# Patient Record
Sex: Female | Born: 1950
Health system: Southern US, Community
[De-identification: ages and names within clinical notes are randomized; demographics above are authoritative.]

## PROBLEM LIST (undated history)

## (undated) DIAGNOSIS — J189 Pneumonia, unspecified organism: Secondary | ICD-10-CM

## (undated) DIAGNOSIS — I499 Cardiac arrhythmia, unspecified: Secondary | ICD-10-CM

## (undated) DIAGNOSIS — Z8489 Family history of other specified conditions: Secondary | ICD-10-CM

## (undated) DIAGNOSIS — M1990 Unspecified osteoarthritis, unspecified site: Secondary | ICD-10-CM

## (undated) DIAGNOSIS — F419 Anxiety disorder, unspecified: Secondary | ICD-10-CM

## (undated) DIAGNOSIS — E7800 Pure hypercholesterolemia, unspecified: Secondary | ICD-10-CM

## (undated) DIAGNOSIS — M1712 Unilateral primary osteoarthritis, left knee: Secondary | ICD-10-CM

## (undated) DIAGNOSIS — D649 Anemia, unspecified: Secondary | ICD-10-CM

## (undated) DIAGNOSIS — M1711 Unilateral primary osteoarthritis, right knee: Secondary | ICD-10-CM

## (undated) DIAGNOSIS — Z96659 Presence of unspecified artificial knee joint: Secondary | ICD-10-CM

## (undated) DIAGNOSIS — F329 Major depressive disorder, single episode, unspecified: Secondary | ICD-10-CM

## (undated) DIAGNOSIS — F32A Depression, unspecified: Secondary | ICD-10-CM

## (undated) DIAGNOSIS — I10 Essential (primary) hypertension: Secondary | ICD-10-CM

## (undated) DIAGNOSIS — R002 Palpitations: Secondary | ICD-10-CM

## (undated) DIAGNOSIS — I480 Paroxysmal atrial fibrillation: Secondary | ICD-10-CM

## (undated) HISTORY — PX: APPENDECTOMY: SHX54

## (undated) HISTORY — DX: Palpitations: R00.2

## (undated) HISTORY — DX: Presence of unspecified artificial knee joint: Z96.659

## (undated) HISTORY — DX: Pure hypercholesterolemia, unspecified: E78.00

## (undated) HISTORY — DX: Paroxysmal atrial fibrillation: I48.0

## (undated) HISTORY — PX: ABDOMINAL HYSTERECTOMY: SHX81

---

## 2003-04-22 HISTORY — PX: CARPAL TUNNEL RELEASE: SHX101

## 2005-06-11 ENCOUNTER — Encounter: Admission: RE | Admit: 2005-06-11 | Discharge: 2005-06-11 | Payer: Self-pay | Admitting: Vascular Surgery

## 2005-06-16 ENCOUNTER — Encounter: Admission: RE | Admit: 2005-06-16 | Discharge: 2005-06-16 | Payer: Self-pay | Admitting: Vascular Surgery

## 2006-06-02 ENCOUNTER — Encounter: Admission: RE | Admit: 2006-06-02 | Discharge: 2006-06-02 | Payer: Self-pay | Admitting: Obstetrics and Gynecology

## 2007-06-04 ENCOUNTER — Encounter: Admission: RE | Admit: 2007-06-04 | Discharge: 2007-06-04 | Payer: Self-pay | Admitting: Obstetrics and Gynecology

## 2008-06-05 ENCOUNTER — Encounter: Admission: RE | Admit: 2008-06-05 | Discharge: 2008-06-05 | Payer: Self-pay | Admitting: Internal Medicine

## 2009-06-06 ENCOUNTER — Encounter: Admission: RE | Admit: 2009-06-06 | Discharge: 2009-06-06 | Payer: Self-pay | Admitting: Internal Medicine

## 2010-05-17 ENCOUNTER — Other Ambulatory Visit: Payer: Self-pay | Admitting: Internal Medicine

## 2010-05-17 DIAGNOSIS — Z1239 Encounter for other screening for malignant neoplasm of breast: Secondary | ICD-10-CM

## 2010-06-10 ENCOUNTER — Ambulatory Visit
Admission: RE | Admit: 2010-06-10 | Discharge: 2010-06-10 | Disposition: A | Discharge status: Home / Self Care | Payer: BC Managed Care – PPO | Source: Ambulatory Visit | Attending: Internal Medicine | Admitting: Internal Medicine

## 2010-06-10 DIAGNOSIS — Z1239 Encounter for other screening for malignant neoplasm of breast: Secondary | ICD-10-CM

## 2011-05-12 ENCOUNTER — Other Ambulatory Visit: Payer: Self-pay | Admitting: Internal Medicine

## 2011-05-12 DIAGNOSIS — Z1231 Encounter for screening mammogram for malignant neoplasm of breast: Secondary | ICD-10-CM

## 2011-06-16 ENCOUNTER — Ambulatory Visit
Admission: RE | Admit: 2011-06-16 | Discharge: 2011-06-16 | Disposition: A | Discharge status: Home / Self Care | Payer: BC Managed Care – PPO | Source: Ambulatory Visit | Attending: Internal Medicine | Admitting: Internal Medicine

## 2011-06-16 DIAGNOSIS — Z1231 Encounter for screening mammogram for malignant neoplasm of breast: Secondary | ICD-10-CM

## 2012-05-24 ENCOUNTER — Other Ambulatory Visit: Payer: Self-pay | Admitting: Internal Medicine

## 2012-05-24 ENCOUNTER — Other Ambulatory Visit (INDEPENDENT_AMBULATORY_CARE_PROVIDER_SITE_OTHER): Payer: Self-pay | Admitting: Surgery

## 2012-05-24 DIAGNOSIS — Z1231 Encounter for screening mammogram for malignant neoplasm of breast: Secondary | ICD-10-CM

## 2012-06-22 ENCOUNTER — Ambulatory Visit
Admission: RE | Admit: 2012-06-22 | Discharge: 2012-06-22 | Disposition: A | Discharge status: Home / Self Care | Payer: BC Managed Care – PPO | Source: Ambulatory Visit | Attending: Internal Medicine | Admitting: Internal Medicine

## 2012-06-22 DIAGNOSIS — Z1231 Encounter for screening mammogram for malignant neoplasm of breast: Secondary | ICD-10-CM

## 2013-05-27 ENCOUNTER — Other Ambulatory Visit: Payer: Self-pay

## 2013-05-27 DIAGNOSIS — Z1231 Encounter for screening mammogram for malignant neoplasm of breast: Secondary | ICD-10-CM

## 2013-06-27 ENCOUNTER — Ambulatory Visit
Admission: RE | Admit: 2013-06-27 | Discharge: 2013-06-27 | Disposition: A | Discharge status: Home / Self Care | Payer: BC Managed Care – PPO | Source: Ambulatory Visit

## 2013-06-27 DIAGNOSIS — Z1231 Encounter for screening mammogram for malignant neoplasm of breast: Secondary | ICD-10-CM

## 2014-05-30 ENCOUNTER — Other Ambulatory Visit: Payer: Self-pay

## 2014-05-30 DIAGNOSIS — Z1231 Encounter for screening mammogram for malignant neoplasm of breast: Secondary | ICD-10-CM

## 2014-07-03 ENCOUNTER — Ambulatory Visit
Admission: RE | Admit: 2014-07-03 | Discharge: 2014-07-03 | Disposition: A | Discharge status: Home / Self Care | Payer: BLUE CROSS/BLUE SHIELD | Source: Ambulatory Visit

## 2014-07-03 DIAGNOSIS — Z1231 Encounter for screening mammogram for malignant neoplasm of breast: Secondary | ICD-10-CM

## 2015-04-29 DIAGNOSIS — I480 Paroxysmal atrial fibrillation: Secondary | ICD-10-CM

## 2015-04-29 HISTORY — DX: Paroxysmal atrial fibrillation: I48.0

## 2015-05-16 DIAGNOSIS — J0100 Acute maxillary sinusitis, unspecified: Secondary | ICD-10-CM | POA: Diagnosis not present

## 2015-05-17 DIAGNOSIS — Z6841 Body Mass Index (BMI) 40.0 and over, adult: Secondary | ICD-10-CM | POA: Diagnosis not present

## 2015-05-17 DIAGNOSIS — I10 Essential (primary) hypertension: Secondary | ICD-10-CM | POA: Diagnosis not present

## 2015-05-17 DIAGNOSIS — I480 Paroxysmal atrial fibrillation: Secondary | ICD-10-CM | POA: Diagnosis not present

## 2015-05-17 DIAGNOSIS — R0683 Snoring: Secondary | ICD-10-CM | POA: Diagnosis not present

## 2015-05-28 ENCOUNTER — Other Ambulatory Visit: Payer: Self-pay

## 2015-05-28 DIAGNOSIS — Z1231 Encounter for screening mammogram for malignant neoplasm of breast: Secondary | ICD-10-CM

## 2015-05-29 DIAGNOSIS — I480 Paroxysmal atrial fibrillation: Secondary | ICD-10-CM | POA: Diagnosis not present

## 2015-07-09 ENCOUNTER — Ambulatory Visit: Payer: BLUE CROSS/BLUE SHIELD

## 2015-07-10 DIAGNOSIS — E785 Hyperlipidemia, unspecified: Secondary | ICD-10-CM | POA: Diagnosis not present

## 2015-07-10 DIAGNOSIS — Z79899 Other long term (current) drug therapy: Secondary | ICD-10-CM | POA: Diagnosis not present

## 2015-07-23 ENCOUNTER — Ambulatory Visit: Payer: BLUE CROSS/BLUE SHIELD

## 2015-08-15 DIAGNOSIS — I10 Essential (primary) hypertension: Secondary | ICD-10-CM | POA: Diagnosis not present

## 2015-08-15 DIAGNOSIS — I480 Paroxysmal atrial fibrillation: Secondary | ICD-10-CM | POA: Diagnosis not present

## 2015-09-18 DIAGNOSIS — R5383 Other fatigue: Secondary | ICD-10-CM | POA: Diagnosis not present

## 2015-09-18 DIAGNOSIS — Z79899 Other long term (current) drug therapy: Secondary | ICD-10-CM | POA: Diagnosis not present

## 2015-09-18 DIAGNOSIS — I480 Paroxysmal atrial fibrillation: Secondary | ICD-10-CM | POA: Diagnosis not present

## 2015-09-18 DIAGNOSIS — I10 Essential (primary) hypertension: Secondary | ICD-10-CM | POA: Diagnosis not present

## 2015-09-18 DIAGNOSIS — E785 Hyperlipidemia, unspecified: Secondary | ICD-10-CM | POA: Diagnosis not present

## 2015-09-18 DIAGNOSIS — M1712 Unilateral primary osteoarthritis, left knee: Secondary | ICD-10-CM | POA: Diagnosis not present

## 2015-12-10 ENCOUNTER — Ambulatory Visit
Admission: RE | Admit: 2015-12-10 | Discharge: 2015-12-10 | Disposition: A | Discharge status: Home / Self Care | Payer: PPO | Source: Ambulatory Visit

## 2015-12-10 DIAGNOSIS — Z1231 Encounter for screening mammogram for malignant neoplasm of breast: Secondary | ICD-10-CM

## 2015-12-13 DIAGNOSIS — L820 Inflamed seborrheic keratosis: Secondary | ICD-10-CM | POA: Diagnosis not present

## 2016-01-24 DIAGNOSIS — M170 Bilateral primary osteoarthritis of knee: Secondary | ICD-10-CM | POA: Diagnosis not present

## 2016-01-24 DIAGNOSIS — M25561 Pain in right knee: Secondary | ICD-10-CM | POA: Diagnosis not present

## 2016-01-24 DIAGNOSIS — M25562 Pain in left knee: Secondary | ICD-10-CM | POA: Diagnosis not present

## 2016-01-24 DIAGNOSIS — G8929 Other chronic pain: Secondary | ICD-10-CM | POA: Diagnosis not present

## 2016-03-03 DIAGNOSIS — I480 Paroxysmal atrial fibrillation: Secondary | ICD-10-CM | POA: Diagnosis not present

## 2016-03-18 DIAGNOSIS — I10 Essential (primary) hypertension: Secondary | ICD-10-CM | POA: Diagnosis not present

## 2016-03-18 DIAGNOSIS — Z79899 Other long term (current) drug therapy: Secondary | ICD-10-CM | POA: Diagnosis not present

## 2016-03-18 DIAGNOSIS — E785 Hyperlipidemia, unspecified: Secondary | ICD-10-CM | POA: Diagnosis not present

## 2016-03-18 DIAGNOSIS — R7301 Impaired fasting glucose: Secondary | ICD-10-CM | POA: Diagnosis not present

## 2016-03-18 DIAGNOSIS — M2550 Pain in unspecified joint: Secondary | ICD-10-CM | POA: Diagnosis not present

## 2016-03-18 DIAGNOSIS — I480 Paroxysmal atrial fibrillation: Secondary | ICD-10-CM | POA: Diagnosis not present

## 2016-05-03 DIAGNOSIS — J111 Influenza due to unidentified influenza virus with other respiratory manifestations: Secondary | ICD-10-CM | POA: Diagnosis not present

## 2016-06-26 DIAGNOSIS — I480 Paroxysmal atrial fibrillation: Secondary | ICD-10-CM | POA: Diagnosis not present

## 2016-06-26 DIAGNOSIS — R040 Epistaxis: Secondary | ICD-10-CM | POA: Diagnosis not present

## 2016-07-09 DIAGNOSIS — H52223 Regular astigmatism, bilateral: Secondary | ICD-10-CM | POA: Diagnosis not present

## 2016-07-09 DIAGNOSIS — H2513 Age-related nuclear cataract, bilateral: Secondary | ICD-10-CM | POA: Diagnosis not present

## 2016-07-23 DIAGNOSIS — M170 Bilateral primary osteoarthritis of knee: Secondary | ICD-10-CM | POA: Diagnosis not present

## 2016-08-06 DIAGNOSIS — J0100 Acute maxillary sinusitis, unspecified: Secondary | ICD-10-CM | POA: Diagnosis not present

## 2016-09-16 DIAGNOSIS — I480 Paroxysmal atrial fibrillation: Secondary | ICD-10-CM | POA: Diagnosis not present

## 2016-09-16 DIAGNOSIS — E785 Hyperlipidemia, unspecified: Secondary | ICD-10-CM | POA: Diagnosis not present

## 2016-09-16 DIAGNOSIS — M1712 Unilateral primary osteoarthritis, left knee: Secondary | ICD-10-CM | POA: Diagnosis not present

## 2016-09-16 DIAGNOSIS — I10 Essential (primary) hypertension: Secondary | ICD-10-CM | POA: Diagnosis not present

## 2016-09-16 DIAGNOSIS — Z79899 Other long term (current) drug therapy: Secondary | ICD-10-CM | POA: Diagnosis not present

## 2016-11-07 DIAGNOSIS — I480 Paroxysmal atrial fibrillation: Secondary | ICD-10-CM | POA: Diagnosis not present

## 2016-11-07 DIAGNOSIS — Z01818 Encounter for other preprocedural examination: Secondary | ICD-10-CM | POA: Diagnosis not present

## 2016-11-07 DIAGNOSIS — I10 Essential (primary) hypertension: Secondary | ICD-10-CM | POA: Diagnosis not present

## 2016-11-07 DIAGNOSIS — M1712 Unilateral primary osteoarthritis, left knee: Secondary | ICD-10-CM | POA: Diagnosis not present

## 2016-11-24 ENCOUNTER — Telehealth: Payer: Self-pay

## 2016-11-24 NOTE — Telephone Encounter (Signed)
Left message to return call 

## 2016-11-24 NOTE — Telephone Encounter (Signed)
Patient informed to stop 3 days prior to surgery, which is 6 doses. Patient verbalized understanding.

## 2016-11-24 NOTE — Telephone Encounter (Signed)
3 days = 6 doses prior to surgery

## 2016-11-24 NOTE — Telephone Encounter (Signed)
Patient is having surgery 12/09/16, knee replacement. Wants to know when to stop Eliquis.

## 2016-11-24 NOTE — Telephone Encounter (Signed)
Call patient med question/surgery clear. Thanks

## 2016-11-25 ENCOUNTER — Other Ambulatory Visit: Payer: Self-pay | Admitting: Orthopedic Surgery

## 2016-11-26 HISTORY — PX: DILATION AND CURETTAGE OF UTERUS: SHX78

## 2016-11-26 NOTE — Pre-Procedure Instructions (Signed)
Bynum BellowsJanice Robbins Nier  11/26/2016      [facility] [place] - [address] [place] [address] Phone: [phone] Fax: [phone]    Your procedure is scheduled on August 21  Report to [facility] Admitting at 0900 A.M.  Call this number if you have problems the morning of surgery:  [phone]   Remember:  Do not eat food or drink liquids after midnight. Continue all other medications as directed by your physician except follow these instructions about you medications    Take these medicines the morning of surgery with A SIP OF WATER acetaminophen (TYLENOL),  cetirizine (ZYRTEC), sertraline (ZOLOFT), traMADol (ULTRAM)  7 days prior to surgery STOP taking any Aspirin, Aleve, Naproxen, Ibuprofen, Motrin, Advil, Goody's, BC's, all herbal medications, fish oil, and all vitamins  Stop ELIQUIS 3 days prior to surgery   Do not wear jewelry, make-up or nail polish.  Do not wear lotions, powders, or perfumes, or deoderant.  Do not shave 48 hours prior to surgery.  Men may shave face and neck.  Do not bring valuables to the hospital.  [facility] is not responsible for any belongings or valuables.  Contacts, dentures or bridgework may not be worn into surgery.  Leave your suitcase in the car.  After surgery it may be brought to your room.  For patients admitted to the hospital, discharge time will be determined by your treatment team.  Patients discharged the day of surgery will not be allowed to drive home.   Special instructions:   [place]- Preparing For Surgery  Before surgery, you can play an important role. Because skin is not sterile, your skin needs to be as free of germs as possible. You can reduce the number of germs on your skin by washing with CHG (chlorahexidine gluconate) Soap before surgery.  CHG is an antiseptic cleaner which kills germs and bonds with the skin to continue killing germs even after  washing.  Please do not use if you have an allergy to CHG or antibacterial soaps. If your skin becomes reddened/irritated stop using the CHG.  Do not shave (including legs and underarms) for at least 48 hours prior to first CHG shower. It is OK to shave your face.  Please follow these instructions carefully.   1. Shower the NIGHT BEFORE SURGERY and the MORNING OF SURGERY with CHG.   2. If you chose to wash your hair, wash your hair first as usual with your normal shampoo.  3. After you shampoo, rinse your hair and body thoroughly to remove the shampoo.  4. Use CHG as you would any other liquid soap. You can apply CHG directly to the skin and wash gently with a scrungie or a clean washcloth.   5. Apply the CHG Soap to your body ONLY FROM THE NECK DOWN.  Do not use on open wounds or open sores. Avoid contact with your eyes, ears, mouth and genitals (private parts). Wash genitals (private parts) with your normal soap.  6. Wash thoroughly, paying special attention to the area where your surgery will be performed.  7. Thoroughly rinse your body with warm water from the neck down.  8. DO NOT shower/wash with your normal soap after using and rinsing off the CHG Soap.  9. Pat yourself dry with a CLEAN TOWEL.   10. Wear CLEAN PAJAMAS   11. Place CLEAN SHEETS on your bed the night of your first shower and DO  NOT SLEEP WITH PETS.    Day of Surgery: Do not apply any deodorants/lotions. Please wear clean clothes to the hospital/surgery center.      Please read over the following fact sheets that you were given.

## 2016-11-27 ENCOUNTER — Encounter (HOSPITAL_COMMUNITY): Payer: Self-pay

## 2016-11-27 ENCOUNTER — Encounter (HOSPITAL_COMMUNITY)
Admission: RE | Admit: 2016-11-27 | Discharge: 2016-11-27 | Disposition: A | Discharge status: Home / Self Care | Payer: PPO | Source: Ambulatory Visit | Attending: Orthopedic Surgery | Admitting: Orthopedic Surgery

## 2016-11-27 DIAGNOSIS — Z01818 Encounter for other preprocedural examination: Secondary | ICD-10-CM | POA: Diagnosis not present

## 2016-11-27 DIAGNOSIS — M1712 Unilateral primary osteoarthritis, left knee: Secondary | ICD-10-CM | POA: Insufficient documentation

## 2016-11-27 HISTORY — DX: Major depressive disorder, single episode, unspecified: F32.9

## 2016-11-27 HISTORY — DX: Anemia, unspecified: D64.9

## 2016-11-27 HISTORY — DX: Anxiety disorder, unspecified: F41.9

## 2016-11-27 HISTORY — DX: Cardiac arrhythmia, unspecified: I49.9

## 2016-11-27 HISTORY — DX: Unspecified osteoarthritis, unspecified site: M19.90

## 2016-11-27 HISTORY — DX: Essential (primary) hypertension: I10

## 2016-11-27 HISTORY — DX: Depression, unspecified: F32.A

## 2016-11-27 HISTORY — DX: Pneumonia, unspecified organism: J18.9

## 2016-11-27 LAB — BASIC METABOLIC PANEL
ANION GAP: 6 (ref 5–15)
BUN: 12 mg/dL (ref 6–20)
CO2: 27 mmol/L (ref 22–32)
Calcium: 9.7 mg/dL (ref 8.9–10.3)
Chloride: 105 mmol/L (ref 101–111)
Creatinine, Ser: 0.84 mg/dL (ref 0.44–1.00)
GFR calc Af Amer: >60 mL/min (ref >60)
GLUCOSE: 94 mg/dL (ref 65–99)
POTASSIUM: 4.5 mmol/L (ref 3.5–5.1)
Sodium: 138 mmol/L (ref 135–145)

## 2016-11-27 LAB — CBC
HEMATOCRIT: 41.7 % (ref 36.0–46.0)
Hemoglobin: 13.5 g/dL (ref 12.0–15.0)
MCH: 29 pg (ref 26.0–34.0)
MCHC: 32.4 g/dL (ref 30.0–36.0)
MCV: 89.7 fL (ref 78.0–100.0)
Platelets: 239 10*3/uL (ref 150–400)
RBC: 4.65 MIL/uL (ref 3.87–5.11)
RDW: 13.4 % (ref 11.5–15.5)
WBC: 8.3 10*3/uL (ref 4.0–10.5)

## 2016-11-27 LAB — SURGICAL PCR SCREEN
MRSA, PCR: NEGATIVE
Staphylococcus aureus: NEGATIVE

## 2016-11-27 NOTE — Progress Notes (Signed)
PCP: Dr. Philemon Kingdomaroline Prochnau, MD  Cardiologist: Dr. Rosanne SackBrain Munley  EKG: pt denies past year  Stress test: pt denies ever  ECHO: believes in the past 2 years, records requested  Cardiac Cath: pt  Denies ever  Chest x-ray: pt denies past year, no recent respiratory complications/complaints

## 2016-11-28 ENCOUNTER — Other Ambulatory Visit: Payer: Self-pay | Admitting: Cardiology

## 2016-11-28 NOTE — Progress Notes (Addendum)
Anesthesia Chart Review:  Pt is a [age] year old female scheduled for L total knee arthroplasty on 12/09/2016 with Teryl LucyJoshua Landau, MD  - PCP is Eyvonne Mechanicarolina Prochnau, MD - Cardiologist is Norman HerrlichBrian Munley, MD who is aware of upcoming surgery.   PMH includes:  HTN, atrial fibrillation, anemia. Never smoker. BMI 48  Medications include: eliquis, benazepril-HCTZ, rosuvastatin. Pt to stop eliquis 3 days before surgery.   Preoperative labs reviewed.    EKG 11/27/16: Sinus bradycardia (59 bpm). Moderate voltage criteria for LVH, may be normal variant  Echo 05/29/15 [facility]( cardiology [place]): 1. LV cavity normal in size. Local wall motion. Visual EF 60-65%. Grade I diastolic dysfunction. Calculated EF 65%. 2. LA size normal.  Gina Mastngela Aldona Bryner, FNP-BC [facility]Anesthesiology Phone: [phone] 11/28/2016 4:41 PM

## 2016-12-08 MED ORDER — DEXTROSE 5 % IV SOLN
3.0000 g | INTRAVENOUS | Status: AC
  Administered 2016-12-09: 3 g via INTRAVENOUS
  Filled 2016-12-08 (×2): qty 3000

## 2016-12-09 ENCOUNTER — Inpatient Hospital Stay (HOSPITAL_COMMUNITY): Payer: PPO | Admitting: Certified Registered"

## 2016-12-09 ENCOUNTER — Inpatient Hospital Stay (HOSPITAL_COMMUNITY): Payer: PPO | Admitting: Emergency Medicine

## 2016-12-09 ENCOUNTER — Inpatient Hospital Stay (HOSPITAL_COMMUNITY): Payer: PPO

## 2016-12-09 ENCOUNTER — Inpatient Hospital Stay (HOSPITAL_COMMUNITY)
Admission: RE | Admit: 2016-12-09 | Discharge: 2016-12-11 | DRG: 470 | Disposition: A | Discharge status: Home Health | Payer: PPO | Source: Ambulatory Visit | Attending: Orthopedic Surgery | Admitting: Orthopedic Surgery

## 2016-12-09 ENCOUNTER — Encounter (HOSPITAL_COMMUNITY)
Admission: RE | Disposition: A | Discharge status: Home Health | Payer: Self-pay | Source: Ambulatory Visit | Attending: Orthopedic Surgery

## 2016-12-09 ENCOUNTER — Encounter (HOSPITAL_COMMUNITY): Payer: Self-pay | Admitting: Urology

## 2016-12-09 DIAGNOSIS — D62 Acute posthemorrhagic anemia: Secondary | ICD-10-CM | POA: Diagnosis not present

## 2016-12-09 DIAGNOSIS — F329 Major depressive disorder, single episode, unspecified: Secondary | ICD-10-CM | POA: Diagnosis present

## 2016-12-09 DIAGNOSIS — Z6841 Body Mass Index (BMI) 40.0 and over, adult: Secondary | ICD-10-CM

## 2016-12-09 DIAGNOSIS — Z79899 Other long term (current) drug therapy: Secondary | ICD-10-CM | POA: Diagnosis not present

## 2016-12-09 DIAGNOSIS — Z471 Aftercare following joint replacement surgery: Secondary | ICD-10-CM | POA: Diagnosis not present

## 2016-12-09 DIAGNOSIS — M1712 Unilateral primary osteoarthritis, left knee: Secondary | ICD-10-CM | POA: Diagnosis not present

## 2016-12-09 DIAGNOSIS — G8918 Other acute postprocedural pain: Secondary | ICD-10-CM | POA: Diagnosis not present

## 2016-12-09 DIAGNOSIS — Z96652 Presence of left artificial knee joint: Secondary | ICD-10-CM | POA: Diagnosis not present

## 2016-12-09 DIAGNOSIS — Z91018 Allergy to other foods: Secondary | ICD-10-CM

## 2016-12-09 DIAGNOSIS — F419 Anxiety disorder, unspecified: Secondary | ICD-10-CM | POA: Diagnosis not present

## 2016-12-09 DIAGNOSIS — Z7901 Long term (current) use of anticoagulants: Secondary | ICD-10-CM

## 2016-12-09 DIAGNOSIS — I10 Essential (primary) hypertension: Secondary | ICD-10-CM | POA: Diagnosis not present

## 2016-12-09 DIAGNOSIS — Z96659 Presence of unspecified artificial knee joint: Secondary | ICD-10-CM

## 2016-12-09 DIAGNOSIS — I4891 Unspecified atrial fibrillation: Secondary | ICD-10-CM | POA: Diagnosis not present

## 2016-12-09 DIAGNOSIS — M1990 Unspecified osteoarthritis, unspecified site: Secondary | ICD-10-CM | POA: Diagnosis not present

## 2016-12-09 HISTORY — DX: Presence of unspecified artificial knee joint: Z96.659

## 2016-12-09 HISTORY — PX: TOTAL KNEE ARTHROPLASTY: SHX125

## 2016-12-09 HISTORY — DX: Unilateral primary osteoarthritis, left knee: M17.12

## 2016-12-09 HISTORY — DX: Morbid (severe) obesity due to excess calories: E66.01

## 2016-12-09 LAB — PROTIME-INR
INR: 1.04
Prothrombin Time: 13.6 seconds (ref 11.4–15.2)

## 2016-12-09 SURGERY — ARTHROPLASTY, KNEE, TOTAL
Anesthesia: Monitor Anesthesia Care | Site: Knee | Laterality: Left

## 2016-12-09 MED ORDER — BENAZEPRIL HCL 20 MG PO TABS
20.0000 mg | ORAL_TABLET | Freq: Every day | ORAL | Status: DC
  Administered 2016-12-10 – 2016-12-11 (×2): 20 mg via ORAL
  Filled 2016-12-09 (×2): qty 1

## 2016-12-09 MED ORDER — HYDROMORPHONE HCL 1 MG/ML IJ SOLN
INTRAMUSCULAR | Status: AC
  Administered 2016-12-09: 0.5 mg via INTRAVENOUS
  Filled 2016-12-09: qty 1

## 2016-12-09 MED ORDER — SODIUM CHLORIDE 0.9 % IR SOLN
Status: DC | PRN
  Administered 2016-12-09: 3000 mL

## 2016-12-09 MED ORDER — ROSUVASTATIN CALCIUM 10 MG PO TABS
20.0000 mg | ORAL_TABLET | Freq: Every evening | ORAL | Status: DC
  Administered 2016-12-09 – 2016-12-10 (×2): 20 mg via ORAL
  Filled 2016-12-09 (×2): qty 2

## 2016-12-09 MED ORDER — ACETAMINOPHEN 650 MG RE SUPP: 650.0000 mg | Freq: Four times a day (QID) | RECTAL | Status: DC | PRN

## 2016-12-09 MED ORDER — VITAMIN D 1000 UNITS PO TABS
1000.0000 [IU] | ORAL_TABLET | Freq: Every day | ORAL | Status: DC
  Administered 2016-12-09 – 2016-12-11 (×3): 1000 [IU] via ORAL
  Filled 2016-12-09 (×3): qty 1

## 2016-12-09 MED ORDER — LACTATED RINGERS IV SOLN
INTRAVENOUS | Status: DC | PRN
  Administered 2016-12-09 (×2): via INTRAVENOUS

## 2016-12-09 MED ORDER — HYDROCHLOROTHIAZIDE 12.5 MG PO CAPS
12.5000 mg | ORAL_CAPSULE | Freq: Every day | ORAL | Status: DC
  Administered 2016-12-10 – 2016-12-11 (×2): 12.5 mg via ORAL
  Filled 2016-12-09 (×2): qty 1

## 2016-12-09 MED ORDER — SENNA-DOCUSATE SODIUM 8.6-50 MG PO TABS
2.0000 | ORAL_TABLET | Freq: Every day | ORAL | 1 refills | Status: DC
Start: 2016-12-09 — End: 2017-08-12

## 2016-12-09 MED ORDER — APIXABAN 5 MG PO TABS
5.0000 mg | ORAL_TABLET | Freq: Two times a day (BID) | ORAL | Status: DC
  Administered 2016-12-10 – 2016-12-11 (×3): 5 mg via ORAL
  Filled 2016-12-09 (×4): qty 1

## 2016-12-09 MED ORDER — KETOROLAC TROMETHAMINE 15 MG/ML IJ SOLN
7.5000 mg | Freq: Four times a day (QID) | INTRAMUSCULAR | Status: AC
  Administered 2016-12-09 – 2016-12-10 (×4): 7.5 mg via INTRAVENOUS
  Filled 2016-12-09 (×3): qty 1

## 2016-12-09 MED ORDER — DOCUSATE SODIUM 100 MG PO CAPS
100.0000 mg | ORAL_CAPSULE | Freq: Two times a day (BID) | ORAL | Status: DC
  Administered 2016-12-09 – 2016-12-11 (×4): 100 mg via ORAL
  Filled 2016-12-09 (×4): qty 1

## 2016-12-09 MED ORDER — ONDANSETRON HCL 4 MG PO TABS: 4.0000 mg | ORAL_TABLET | Freq: Three times a day (TID) | ORAL | 0 refills | Status: DC | PRN

## 2016-12-09 MED ORDER — BISACODYL 10 MG RE SUPP: 10.0000 mg | Freq: Every day | RECTAL | Status: DC | PRN

## 2016-12-09 MED ORDER — ELIQUIS 5 MG PO TABS: 5.0000 mg | ORAL_TABLET | Freq: Two times a day (BID) | ORAL | 0 refills | Status: AC

## 2016-12-09 MED ORDER — ONDANSETRON HCL 4 MG/2ML IJ SOLN: 4.0000 mg | Freq: Four times a day (QID) | INTRAMUSCULAR | Status: DC | PRN

## 2016-12-09 MED ORDER — CEFAZOLIN SODIUM-DEXTROSE 2-4 GM/100ML-% IV SOLN
2.0000 g | Freq: Four times a day (QID) | INTRAVENOUS | Status: AC
  Administered 2016-12-09 – 2016-12-10 (×2): 2 g via INTRAVENOUS
  Filled 2016-12-09 (×2): qty 100

## 2016-12-09 MED ORDER — METHOCARBAMOL 500 MG PO TABS
500.0000 mg | ORAL_TABLET | Freq: Four times a day (QID) | ORAL | Status: DC | PRN
  Administered 2016-12-09 – 2016-12-11 (×4): 500 mg via ORAL
  Filled 2016-12-09 (×4): qty 1

## 2016-12-09 MED ORDER — ACETAMINOPHEN 325 MG PO TABS
650.0000 mg | ORAL_TABLET | Freq: Four times a day (QID) | ORAL | Status: DC | PRN
  Administered 2016-12-10 – 2016-12-11 (×2): 650 mg via ORAL
  Filled 2016-12-09 (×2): qty 2

## 2016-12-09 MED ORDER — OXYCODONE HCL 5 MG PO TABS
ORAL_TABLET | ORAL | Status: AC
  Administered 2016-12-09: 10 mg via ORAL
  Filled 2016-12-09: qty 2

## 2016-12-09 MED ORDER — MIDAZOLAM HCL 2 MG/2ML IJ SOLN
INTRAMUSCULAR | Status: AC
  Filled 2016-12-09: qty 2

## 2016-12-09 MED ORDER — PHENYLEPHRINE HCL 10 MG/ML IJ SOLN
INTRAVENOUS | Status: DC | PRN
  Administered 2016-12-09: 20 ug/min via INTRAVENOUS

## 2016-12-09 MED ORDER — VITAMIN E 180 MG (400 UNIT) PO CAPS
400.0000 [IU] | ORAL_CAPSULE | Freq: Every day | ORAL | Status: DC
  Administered 2016-12-09 – 2016-12-11 (×3): 400 [IU] via ORAL
  Filled 2016-12-09 (×3): qty 1

## 2016-12-09 MED ORDER — FENTANYL CITRATE (PF) 100 MCG/2ML IJ SOLN
INTRAMUSCULAR | Status: AC
  Filled 2016-12-09: qty 2

## 2016-12-09 MED ORDER — OXYCODONE HCL 5 MG/5ML PO SOLN: 5.0000 mg | Freq: Once | ORAL | Status: DC | PRN

## 2016-12-09 MED ORDER — FENTANYL CITRATE (PF) 100 MCG/2ML IJ SOLN
50.0000 ug | Freq: Once | INTRAMUSCULAR | Status: AC
  Administered 2016-12-09: 50 ug via INTRAVENOUS

## 2016-12-09 MED ORDER — SERTRALINE HCL 50 MG PO TABS
50.0000 mg | ORAL_TABLET | Freq: Every day | ORAL | Status: DC
  Administered 2016-12-10 – 2016-12-11 (×2): 50 mg via ORAL
  Filled 2016-12-09 (×2): qty 1

## 2016-12-09 MED ORDER — PROPOFOL 500 MG/50ML IV EMUL
INTRAVENOUS | Status: DC | PRN
  Administered 2016-12-09: 55 ug/kg/min via INTRAVENOUS

## 2016-12-09 MED ORDER — PROMETHAZINE HCL 25 MG/ML IJ SOLN
6.2500 mg | INTRAMUSCULAR | Status: AC | PRN
  Administered 2016-12-09 (×2): 6.25 mg via INTRAVENOUS

## 2016-12-09 MED ORDER — METOCLOPRAMIDE HCL 5 MG PO TABS: 5.0000 mg | ORAL_TABLET | Freq: Three times a day (TID) | ORAL | Status: DC | PRN

## 2016-12-09 MED ORDER — HYDROMORPHONE HCL 1 MG/ML IJ SOLN
0.2500 mg | INTRAMUSCULAR | Status: DC | PRN
  Administered 2016-12-09 (×3): 0.5 mg via INTRAVENOUS

## 2016-12-09 MED ORDER — POTASSIUM CHLORIDE IN NACL 20-0.45 MEQ/L-% IV SOLN
INTRAVENOUS | Status: DC
  Administered 2016-12-09: 20:00:00 via INTRAVENOUS
  Filled 2016-12-09 (×2): qty 1000

## 2016-12-09 MED ORDER — POLYETHYLENE GLYCOL 3350 17 G PO PACK: 17.0000 g | PACK | Freq: Every day | ORAL | Status: DC | PRN

## 2016-12-09 MED ORDER — BACLOFEN 10 MG PO TABS: 10.0000 mg | ORAL_TABLET | Freq: Three times a day (TID) | ORAL | 0 refills | Status: DC

## 2016-12-09 MED ORDER — MIDAZOLAM HCL 2 MG/2ML IJ SOLN
2.0000 mg | Freq: Once | INTRAMUSCULAR | Status: AC
  Administered 2016-12-09: 2 mg via INTRAVENOUS

## 2016-12-09 MED ORDER — PROMETHAZINE HCL 25 MG/ML IJ SOLN
INTRAMUSCULAR | Status: AC
  Administered 2016-12-09: 6.25 mg via INTRAVENOUS
  Filled 2016-12-09: qty 1

## 2016-12-09 MED ORDER — DIPHENHYDRAMINE HCL 12.5 MG/5ML PO ELIX: 12.5000 mg | ORAL_SOLUTION | ORAL | Status: DC | PRN

## 2016-12-09 MED ORDER — BUPIVACAINE IN DEXTROSE 0.75-8.25 % IT SOLN
INTRATHECAL | Status: DC | PRN
  Administered 2016-12-09: 2 mL via INTRATHECAL

## 2016-12-09 MED ORDER — TRAMADOL HCL 50 MG PO TABS
50.0000 mg | ORAL_TABLET | Freq: Four times a day (QID) | ORAL | Status: DC | PRN
  Administered 2016-12-10: 50 mg via ORAL
  Filled 2016-12-09: qty 1

## 2016-12-09 MED ORDER — SENNA 8.6 MG PO TABS
1.0000 | ORAL_TABLET | Freq: Two times a day (BID) | ORAL | Status: DC
  Administered 2016-12-09 – 2016-12-11 (×4): 8.6 mg via ORAL
  Filled 2016-12-09 (×4): qty 1

## 2016-12-09 MED ORDER — KETOROLAC TROMETHAMINE 15 MG/ML IJ SOLN
INTRAMUSCULAR | Status: AC
  Administered 2016-12-09: 7.5 mg via INTRAVENOUS
  Filled 2016-12-09: qty 1

## 2016-12-09 MED ORDER — MENTHOL 3 MG MT LOZG: 1.0000 | LOZENGE | OROMUCOSAL | Status: DC | PRN

## 2016-12-09 MED ORDER — MAGNESIUM CITRATE PO SOLN: 1.0000 | Freq: Once | ORAL | Status: DC | PRN

## 2016-12-09 MED ORDER — OXYCODONE HCL 5 MG PO TABS
5.0000 mg | ORAL_TABLET | Freq: Once | ORAL | Status: DC | PRN
Start: 2016-12-09 — End: 2016-12-09

## 2016-12-09 MED ORDER — LORATADINE 10 MG PO TABS
10.0000 mg | ORAL_TABLET | Freq: Every day | ORAL | Status: DC
  Administered 2016-12-10 – 2016-12-11 (×2): 10 mg via ORAL
  Filled 2016-12-09 (×2): qty 1

## 2016-12-09 MED ORDER — BENAZEPRIL-HYDROCHLOROTHIAZIDE 20-12.5 MG PO TABS: 1.0000 | ORAL_TABLET | Freq: Every day | ORAL | Status: DC

## 2016-12-09 MED ORDER — PROPOFOL 10 MG/ML IV BOLUS
INTRAVENOUS | Status: DC | PRN
  Administered 2016-12-09: 20 mg via INTRAVENOUS

## 2016-12-09 MED ORDER — ONDANSETRON HCL 4 MG PO TABS: 4.0000 mg | ORAL_TABLET | Freq: Four times a day (QID) | ORAL | Status: DC | PRN

## 2016-12-09 MED ORDER — OXYCODONE HCL 5 MG PO TABS
5.0000 mg | ORAL_TABLET | ORAL | Status: DC | PRN
  Administered 2016-12-09 – 2016-12-11 (×8): 10 mg via ORAL
  Filled 2016-12-09 (×7): qty 2

## 2016-12-09 MED ORDER — ALUM & MAG HYDROXIDE-SIMETH 200-200-20 MG/5ML PO SUSP: 30.0000 mL | ORAL | Status: DC | PRN

## 2016-12-09 MED ORDER — PHENOL 1.4 % MT LIQD: 1.0000 | OROMUCOSAL | Status: DC | PRN

## 2016-12-09 MED ORDER — HYDROMORPHONE HCL 1 MG/ML IJ SOLN: 0.5000 mg | INTRAMUSCULAR | Status: DC | PRN

## 2016-12-09 MED ORDER — EPINEPHRINE 0.3 MG/0.3ML IJ SOAJ: 0.3000 mg | Freq: Every day | INTRAMUSCULAR | Status: DC | PRN

## 2016-12-09 MED ORDER — 0.9 % SODIUM CHLORIDE (POUR BTL) OPTIME
TOPICAL | Status: DC | PRN
  Administered 2016-12-09: 1000 mL

## 2016-12-09 MED ORDER — PROPOFOL 10 MG/ML IV BOLUS
INTRAVENOUS | Status: AC
  Filled 2016-12-09: qty 20

## 2016-12-09 MED ORDER — ROPIVACAINE HCL 5 MG/ML IJ SOLN
INTRAMUSCULAR | Status: DC | PRN
  Administered 2016-12-09: 20 mL via PERINEURAL

## 2016-12-09 MED ORDER — LACTATED RINGERS IV SOLN: INTRAVENOUS | Status: DC

## 2016-12-09 MED ORDER — GLYCOPYRROLATE 0.2 MG/ML IJ SOLN
INTRAMUSCULAR | Status: DC | PRN
  Administered 2016-12-09: 0.2 mg via INTRAVENOUS
  Administered 2016-12-09: .1 mg via INTRAVENOUS

## 2016-12-09 MED ORDER — METOCLOPRAMIDE HCL 5 MG/ML IJ SOLN: 5.0000 mg | Freq: Three times a day (TID) | INTRAMUSCULAR | Status: DC | PRN

## 2016-12-09 MED ORDER — DEXAMETHASONE SODIUM PHOSPHATE 10 MG/ML IJ SOLN
10.0000 mg | Freq: Once | INTRAMUSCULAR | Status: AC
  Administered 2016-12-10: 10 mg via INTRAVENOUS
  Filled 2016-12-09: qty 1

## 2016-12-09 MED ORDER — FENTANYL CITRATE (PF) 250 MCG/5ML IJ SOLN
INTRAMUSCULAR | Status: AC
Start: 2016-12-09 — End: 2016-12-09
  Filled 2016-12-09: qty 5

## 2016-12-09 MED ORDER — OXYCODONE HCL 5 MG PO TABS: 5.0000 mg | ORAL_TABLET | ORAL | 0 refills | Status: DC | PRN

## 2016-12-09 MED ORDER — METHOCARBAMOL 1000 MG/10ML IJ SOLN
500.0000 mg | Freq: Four times a day (QID) | INTRAVENOUS | Status: DC | PRN
  Filled 2016-12-09: qty 5

## 2016-12-09 SURGICAL SUPPLY — 54 items
BANDAGE ACE 6X5 VEL STRL LF (GAUZE/BANDAGES/DRESSINGS) ×3 IMPLANT
BANDAGE ESMARK 6X9 LF (GAUZE/BANDAGES/DRESSINGS) ×1 IMPLANT
BLADE SAG 18X100X1.27 (BLADE) ×3 IMPLANT
BLADE SAW SGTL 13X75X1.27 (BLADE) ×3 IMPLANT
BNDG ELASTIC 6X10 VLCR STRL LF (GAUZE/BANDAGES/DRESSINGS) ×6 IMPLANT
BNDG ESMARK 6X9 LF (GAUZE/BANDAGES/DRESSINGS) ×3
BOWL SMART MIX CTS (DISPOSABLE) ×3 IMPLANT
CAP KNEE TOTAL 3 SIGMA ×3 IMPLANT
CEMENT HV SMART SET (Cement) ×6 IMPLANT
CLOSURE STERI-STRIP 1/2X4 (GAUZE/BANDAGES/DRESSINGS) ×1
CLSR STERI-STRIP ANTIMIC 1/2X4 (GAUZE/BANDAGES/DRESSINGS) ×2 IMPLANT
COVER SURGICAL LIGHT HANDLE (MISCELLANEOUS) ×3 IMPLANT
CUFF TOURNIQUET SINGLE 34IN LL (TOURNIQUET CUFF) ×3 IMPLANT
DRAPE HALF SHEET 40X57 (DRAPES) ×3 IMPLANT
DRAPE U-SHAPE 47X51 STRL (DRAPES) ×3 IMPLANT
DURAPREP 26ML APPLICATOR (WOUND CARE) ×3 IMPLANT
ELECT CAUTERY BLADE 6.4 (BLADE) ×3 IMPLANT
ELECT REM PT RETURN 9FT ADLT (ELECTROSURGICAL) ×3
ELECTRODE REM PT RTRN 9FT ADLT (ELECTROSURGICAL) ×1 IMPLANT
GAUZE SPONGE 4X4 12PLY STRL (GAUZE/BANDAGES/DRESSINGS) ×3 IMPLANT
GAUZE SPONGE 4X4 12PLY STRL LF (GAUZE/BANDAGES/DRESSINGS) ×3 IMPLANT
GLOVE BIOGEL PI ORTHO PRO SZ8 (GLOVE) ×4
GLOVE ORTHO TXT STRL SZ7.5 (GLOVE) ×3 IMPLANT
GLOVE PI ORTHO PRO STRL SZ8 (GLOVE) ×2 IMPLANT
GLOVE SURG ORTHO 8.0 STRL STRW (GLOVE) ×3 IMPLANT
GOWN STRL REUS W/ TWL XL LVL3 (GOWN DISPOSABLE) ×1 IMPLANT
GOWN STRL REUS W/TWL 2XL LVL3 (GOWN DISPOSABLE) ×3 IMPLANT
GOWN STRL REUS W/TWL XL LVL3 (GOWN DISPOSABLE) ×2
HANDPIECE INTERPULSE COAX TIP (DISPOSABLE) ×2
HOOD PEEL AWAY FACE SHEILD DIS (HOOD) ×6 IMPLANT
IMMOBILIZER KNEE 22 (SOFTGOODS) ×3 IMPLANT
KIT BASIN OR (CUSTOM PROCEDURE TRAY) ×3 IMPLANT
KIT ROOM TURNOVER OR (KITS) ×3 IMPLANT
MANIFOLD NEPTUNE II (INSTRUMENTS) ×3 IMPLANT
NEEDLE 18GX1X1/2 (RX/OR ONLY) (NEEDLE) ×3 IMPLANT
NS IRRIG 1000ML POUR BTL (IV SOLUTION) ×3 IMPLANT
PACK TOTAL JOINT (CUSTOM PROCEDURE TRAY) ×3 IMPLANT
PAD ABD 8X10 STRL (GAUZE/BANDAGES/DRESSINGS) ×3 IMPLANT
PAD ARMBOARD 7.5X6 YLW CONV (MISCELLANEOUS) ×6 IMPLANT
PAD CAST 4YDX4 CTTN HI CHSV (CAST SUPPLIES) ×1 IMPLANT
PADDING CAST COTTON 4X4 STRL (CAST SUPPLIES) ×2
PADDING CAST COTTON 6X4 STRL (CAST SUPPLIES) ×3 IMPLANT
SET HNDPC FAN SPRY TIP SCT (DISPOSABLE) ×1 IMPLANT
SUCTION FRAZIER HANDLE 10FR (MISCELLANEOUS) ×2
SUCTION TUBE FRAZIER 10FR DISP (MISCELLANEOUS) ×1 IMPLANT
SUT VIC AB 0 CT1 27 (SUTURE) ×2
SUT VIC AB 0 CT1 27XBRD ANBCTR (SUTURE) ×1 IMPLANT
SUT VIC AB 2-0 CT1 27 (SUTURE) ×2
SUT VIC AB 2-0 CT1 TAPERPNT 27 (SUTURE) ×1 IMPLANT
SUT VIC AB 3-0 SH 8-18 (SUTURE) ×6 IMPLANT
SYR 30ML LL (SYRINGE) IMPLANT
TOWEL OR 17X24 6PK STRL BLUE (TOWEL DISPOSABLE) ×3 IMPLANT
TOWEL OR 17X26 10 PK STRL BLUE (TOWEL DISPOSABLE) ×3 IMPLANT
TRAY CATH 16FR W/PLASTIC CATH (SET/KITS/TRAYS/PACK) IMPLANT

## 2016-12-09 NOTE — Transfer of Care (Signed)
Immediate Anesthesia Transfer of Care Note  Patient: Gina Coffey  Procedure(s) Performed: Procedure(s): TOTAL KNEE ARTHROPLASTY (Left)  Patient Location: PACU  Anesthesia Type:MAC and Regional  Level of Consciousness: awake, alert , oriented and sedated  Airway & Oxygen Therapy: Patient Spontanous Breathing and Patient connected to nasal cannula oxygen  Post-op Assessment: Report given to RN, Post -op Vital signs reviewed and stable and Patient moving all extremities  Post vital signs: Reviewed and stable  Last Vitals:  Vitals:   12/09/16 1015 12/09/16 1020  BP: (!) 112/52 (!) 127/52  Pulse: (!) 58 (!) 58  Resp: 20 20  Temp:    SpO2: 100% 98%    Last Pain:  Vitals:   12/09/16 0906  TempSrc: Oral         Complications: No apparent anesthesia complications

## 2016-12-09 NOTE — Anesthesia Preprocedure Evaluation (Signed)
Anesthesia Evaluation  Patient identified by MRN, date of birth, ID band Patient awake    Reviewed: Allergy & Precautions, NPO status , Patient's Chart, lab work & pertinent test results, reviewed documented beta blocker date and time   Airway Mallampati: II  TM Distance: >3 FB Neck ROM: Full    Dental no notable dental hx.    Pulmonary neg pulmonary ROS,    Pulmonary exam normal breath sounds clear to auscultation       Cardiovascular hypertension, Pt. on medications and Pt. on home beta blockers negative cardio ROS Normal cardiovascular exam+ dysrhythmias Atrial Fibrillation  Rhythm:Regular Rate:Normal     Neuro/Psych Anxiety Depression negative neurological ROS  negative psych ROS   GI/Hepatic negative GI ROS, Neg liver ROS,   Endo/Other  negative endocrine ROSMorbid obesity  Renal/GU negative Renal ROS  negative genitourinary   Musculoskeletal negative musculoskeletal ROS (+) Arthritis ,   Abdominal   Peds negative pediatric ROS (+)  Hematology negative hematology ROS (+)   Anesthesia Other Findings   Reproductive/Obstetrics negative OB ROS                             Anesthesia Physical Anesthesia Plan  ASA: III  Anesthesia Plan: Spinal   Post-op Pain Management:  Regional for Post-op pain   Induction: Intravenous  PONV Risk Score and Plan: 2 and Ondansetron and Midazolam  Airway Management Planned: Simple Face Mask  Additional Equipment:   Intra-op Plan:   Post-operative Plan:   Informed Consent: I have reviewed the patients History and Physical, chart, labs and discussed the procedure including the risks, benefits and alternatives for the proposed anesthesia with the patient or authorized representative who has indicated his/her understanding and acceptance.   Dental advisory given  Plan Discussed with: CRNA  Anesthesia Plan Comments:         Anesthesia  Quick Evaluation

## 2016-12-09 NOTE — Plan of Care (Signed)
Problem: Activity: Goal: Ability to avoid complications of mobility impairment will improve Outcome: Progressing Ambulated pt to . Pt tolerated well

## 2016-12-09 NOTE — Anesthesia Procedure Notes (Signed)
Date/Time: 12/09/2016 11:30 AM Performed by: Wilder Glade Pre-anesthesia Checklist: Patient identified, Emergency Drugs available, Suction available, Patient being monitored and Timeout performed Oxygen Delivery Method: Simple face mask Placement Confirmation: positive ETCO2

## 2016-12-09 NOTE — Anesthesia Procedure Notes (Signed)
Anesthesia Regional Block: Adductor canal block   Pre-Anesthetic Checklist: ,, timeout performed, Correct Patient, Correct Site, Correct Laterality, Correct Procedure, Correct Position, site marked, Risks and benefits discussed,  Surgical consent,  Pre-op evaluation,  At surgeon's request and post-op pain management  Laterality: Left  Prep: chloraprep       Needles:  Injection technique: Single-shot  Needle Type: Stimiplex     Needle Length: 9cm  Needle Gauge: 21     Additional Needles:   Procedures: ultrasound guided,,,,,,,,  Narrative:  Start time: 12/09/2016 10:06 AM End time: 12/09/2016 10:11 AM Injection made incrementally with aspirations every 5 mL.  Performed by: Personally  Anesthesiologist: Anitra Lauth RAY

## 2016-12-09 NOTE — Op Note (Signed)
DATE OF SURGERY:  12/09/2016 TIME: 1:44 PM  PATIENT NAME:  Gina Coffey   AGE: 66 y.o.    PRE-OPERATIVE DIAGNOSIS:  Left knee primary localized osteoarthritis  POST-OPERATIVE DIAGNOSIS:  Same  PROCEDURE:  Procedure(s): TOTAL KNEE ARTHROPLASTY   SURGEON:  Eulas Post, MD   ASSISTANT:  Janace Litten, OPA-C, present and scrubbed throughout the case, critical for assistance with exposure, retraction, instrumentation, and closure.   OPERATIVE IMPLANTS: Biomet Vanguard Fixed Bearing Posterior Stabilized Femur size 70, Tibia size 75, Patella size 31 3-peg oval button, with a 10 mm polyethylene insert.   PREOPERATIVE INDICATIONS:  Gina Coffey is a 67 y.o. year old female with end stage bone on bone degenerative arthritis of the knee who failed conservative treatment, including injections, antiinflammatories, activity modification, and assistive devices, and had significant impairment of their activities of daily living, and elected for Total Knee Arthroplasty.   The risks, benefits, and alternatives were discussed at length including but not limited to the risks of infection, bleeding, nerve injury, stiffness, blood clots, the need for revision surgery, cardiopulmonary complications, among others, and they were willing to proceed.  OPERATIVE FINDINGS AND UNIQUE ASPECTS OF THE CASE:  The tourniquet was a venous tourniquet and was released after 3 minutes, her morbid obesity precluded our ability to use a tourniquet.   ESTIMATED BLOOD LOSS: 300 mL  OPERATIVE DESCRIPTION:  The patient was brought to the operative room and placed in a supine position.  Spinal anesthesia was administered.  IV antibiotics were given with 3 g of Ancef.  The lower extremity was prepped and draped in the usual sterile fashion.  Time out was performed.  The leg was elevated and exsanguinated and the tourniquet was inflated. During the initial approach however it was clear that this was a  venous tourniquet, and the tourniquet was released.  Anterior quadriceps tendon splitting approach was performed.  The patella was everted and osteophytes were removed.  The anterior horn of the medial and lateral meniscus was removed.   The distal femur was opened with the drill and the intramedullary distal femoral cutting jig was utilized, set at 5 degrees resecting 10 mm off the distal femur.  Care was taken to protect the collateral ligaments.  Then the extramedullary tibial cutting jig was utilized making the appropriate cut using the anterior tibial crest as a reference building in appropriate posterior slope.  Care was taken during the cut to protect the medial and collateral ligaments.  The proximal tibia was removed along with the posterior horns of the menisci.  The PCL was sacrificed.    The extensor gap was measured and was approximately 10mm.    The distal femoral sizing jig was applied, taking care to avoid notching.  Then the 4-in-1 cutting jig was applied and the anterior and posterior femur was cut, along with the chamfer cuts.  All posterior osteophytes were removed.  The flexion gap was then measured and was symmetric with the extension gap.  I completed the distal femoral preparation using the appropriate jig to prepare the box.  The patella was then measured, and cut with the saw.  The thickness before the cut was 21 and after the cut was 14.  The proximal tibia sized and prepared accordingly with the reamer and the punch, and then all components were trialed with the 10mm poly insert.  The knee was found to have excellent balance and full motion.    The above named components were then cemented into  place and all excess cement was removed.  The real polyethylene implant was placed.  After the cement had cured I released the tourniquet and confirmed excellent hemostasis with no major posterior vessel injury.    The knee was easily taken through a range of motion and the  patella tracked well and the knee irrigated copiously and the parapatellar and subcutaneous tissue closed with vicryl, and monocryl with steri strips for the skin.  The wounds were injected with marcaine, and dressed with sterile gauze and the patient was awakened and returned to the PACU in stable and satisfactory condition.  There were no complications.  Total tourniquet time was 4 minutes.

## 2016-12-09 NOTE — Anesthesia Procedure Notes (Signed)
Spinal  Patient location during procedure: OR Start time: 12/09/2016 11:38 AM End time: 12/09/2016 11:43 AM Staffing Anesthesiologist: Anitra Lauth RAY Performed: anesthesiologist  Preanesthetic Checklist Completed: patient identified, site marked, surgical consent, pre-op evaluation, timeout performed, IV checked, risks and benefits discussed and monitors and equipment checked Spinal Block Patient position: sitting Prep: Betadine Patient monitoring: heart rate, cardiac monitor, continuous pulse ox and blood pressure Approach: midline Location: L3-4 Injection technique: single-shot Needle Needle type: Quincke  Needle gauge: 22 G Needle length: 9 cm
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
## 2016-12-10 ENCOUNTER — Encounter (HOSPITAL_COMMUNITY): Payer: Self-pay | Admitting: Orthopedic Surgery

## 2016-12-10 LAB — BASIC METABOLIC PANEL
ANION GAP: 5 (ref 5–15)
BUN: 13 mg/dL (ref 6–20)
CO2: 26 mmol/L (ref 22–32)
Calcium: 8.4 mg/dL — ABNORMAL LOW (ref 8.9–10.3)
Chloride: 105 mmol/L (ref 101–111)
Creatinine, Ser: 0.85 mg/dL (ref 0.44–1.00)
GFR calc Af Amer: >60 mL/min (ref >60)
GLUCOSE: 118 mg/dL — AB (ref 65–99)
POTASSIUM: 4.4 mmol/L (ref 3.5–5.1)
Sodium: 136 mmol/L (ref 135–145)

## 2016-12-10 LAB — CBC
HEMATOCRIT: 29.3 % — AB (ref 36.0–46.0)
HEMOGLOBIN: 9.3 g/dL — AB (ref 12.0–15.0)
MCH: 28 pg (ref 26.0–34.0)
MCHC: 31.7 g/dL (ref 30.0–36.0)
MCV: 88.3 fL (ref 78.0–100.0)
PLATELETS: 187 10*3/uL (ref 150–400)
RBC: 3.32 MIL/uL — AB (ref 3.87–5.11)
RDW: 13.2 % (ref 11.5–15.5)
WBC: 11.3 10*3/uL — AB (ref 4.0–10.5)

## 2016-12-10 NOTE — Progress Notes (Signed)
OT Cancellation Note  Patient Details Name: Gina Coffey MRN: [id number] DOB: 10-24-1950   Cancelled Treatment:    Reason Eval/Treat Not Completed: Other (comment); Pt sleeping upon arrival to room, easily aroused though appears lethargic; Pt reporting feeling increasingly lethargic after recent meds, politely asking OT return at a later time. Will follow up as schedule permits.   Marcy Siren, OT Pager [phone] 12/10/2016   [name] 12/10/2016, 11:36 AM

## 2016-12-10 NOTE — Discharge Instructions (Signed)
Information on my medicine - ELIQUIS (apixaban)  This medication education was reviewed with me or my healthcare representative as part of my discharge preparation.   Why was Eliquis prescribed for you? Eliquis was prescribed for you to reduce the risk of blood clots forming after orthopedic surgery.    What do You need to know about Eliquis? Take your Eliquis TWICE DAILY - one tablet in the morning and one tablet in the evening with or without food.  It would be best to take the dose about the same time each day.  If you have difficulty swallowing the tablet whole please discuss with your pharmacist how to take the medication safely.  Take Eliquis exactly as prescribed by your doctor and DO NOT stop taking Eliquis without talking to the doctor who prescribed the medication.  Stopping without other medication to take the place of Eliquis may increase your risk of developing a clot.  After discharge, you should have regular check-up appointments with your healthcare provider that is prescribing your Eliquis.  What do you do if you miss a dose? If a dose of ELIQUIS is not taken at the scheduled time, take it as soon as possible on the same day and twice-daily administration should be resumed.  The dose should not be doubled to make up for a missed dose.  Do not take more than one tablet of ELIQUIS at the same time.  Important Safety Information A possible side effect of Eliquis is bleeding. You should call your healthcare provider right away if you experience any of the following: ? Bleeding from an injury or your nose that does not stop. ? Unusual colored urine (red or dark brown) or unusual colored stools (red or black). ? Unusual bruising for unknown reasons. ? A serious fall or if you hit your head (even if there is no bleeding).  Some medicines may interact with Eliquis and might increase your risk of bleeding or clotting while on Eliquis. To help avoid this, consult your  healthcare provider or pharmacist prior to using any new prescription or non-prescription medications, including herbals, vitamins, non-steroidal anti-inflammatory drugs (NSAIDs) and supplements.  This website has more information on Eliquis (apixaban): http://www.eliquis.com/eliquis/home     INSTRUCTIONS AFTER JOINT REPLACEMENT   o Remove items at home which could result in a fall. This includes throw rugs or furniture in walking pathways o ICE to the affected joint every three hours while awake for 30 minutes at a time, for at least the first 3-5 days, and then as needed for pain and swelling.  Continue to use ice for pain and swelling. You may notice swelling that will progress down to the foot and ankle.  This is normal after surgery.  Elevate your leg when you are not up walking on it.   o Continue to use the breathing machine you got in the hospital (incentive spirometer) which will help keep your temperature down.  It is common for your temperature to cycle up and down following surgery, especially at night when you are not up moving around and exerting yourself.  The breathing machine keeps your lungs expanded and your temperature down.   DIET:  As you were doing prior to hospitalization, we recommend a well-balanced diet.  DRESSING / WOUND CARE / SHOWERING  You may change your dressing 3-5 days after surgery.  Then change the dressing every day with sterile gauze.  Please use good hand washing techniques before changing the dressing.  Do not use any  lotions or creams on the incision until instructed by your surgeon.  ACTIVITY  o Increase activity slowly as tolerated, but follow the weight bearing instructions below.   o No driving for 6 weeks or until further direction given by your physician.  You cannot drive while taking narcotics.  o No lifting or carrying greater than 10 lbs. until further directed by your surgeon. o Avoid periods of inactivity such as sitting longer than an  hour when not asleep. This helps prevent blood clots.  o You may return to work once you are authorized by your doctor.     WEIGHT BEARING   Weight bearing as tolerated with assist device (walker, cane, etc) as directed, use it as long as suggested by your surgeon or therapist, typically at least 4-6 weeks.   EXERCISES  Results after joint replacement surgery are often greatly improved when you follow the exercise, range of motion and muscle strengthening exercises prescribed by your doctor. Safety measures are also important to protect the joint from further injury. Any time any of these exercises cause you to have increased pain or swelling, decrease what you are doing until you are comfortable again and then slowly increase them. If you have problems or questions, call your caregiver or physical therapist for advice.   Rehabilitation is important following a joint replacement. After just a few days of immobilization, the muscles of the leg can become weakened and shrink (atrophy).  These exercises are designed to build up the tone and strength of the thigh and leg muscles and to improve motion. Often times heat used for twenty to thirty minutes before working out will loosen up your tissues and help with improving the range of motion but do not use heat for the first two weeks following surgery (sometimes heat can increase post-operative swelling).   These exercises can be done on a training (exercise) mat, on the floor, on a table or on a bed. Use whatever works the best and is most comfortable for you.    Use music or television while you are exercising so that the exercises are a pleasant break in your day. This will make your life better with the exercises acting as a break in your routine that you can look forward to.   Perform all exercises about fifteen times, three times per day or as directed.  You should exercise both the operative leg and the other leg as well.  Exercises include:      Quad Sets - Tighten up the muscle on the front of the thigh (Quad) and hold for 5-10 seconds.    Straight Leg Raises - With your knee straight (if you were given a brace, keep it on), lift the leg to 60 degrees, hold for 3 seconds, and slowly lower the leg.  Perform this exercise against resistance later as your leg gets stronger.   Leg Slides: Lying on your back, slowly slide your foot toward your buttocks, bending your knee up off the floor (only go as far as is comfortable). Then slowly slide your foot back down until your leg is flat on the floor again.   Angel Wings: Lying on your back spread your legs to the side as far apart as you can without causing discomfort.   Hamstring Strength:  Lying on your back, push your heel against the floor with your leg straight by tightening up the muscles of your buttocks.  Repeat, but this time bend your knee to a comfortable angle,  and push your heel against the floor.  You may put a pillow under the heel to make it more comfortable if necessary.  ° °A rehabilitation program following joint replacement surgery can speed recovery and prevent re-injury in the future due to weakened muscles. Contact your doctor or a physical therapist for more information on knee rehabilitation.  ° ° °CONSTIPATION ° °Constipation is defined medically as fewer than three stools per week and severe constipation as less than one stool per week.  Even if you have a regular bowel pattern at home, your normal regimen is likely to be disrupted due to multiple reasons following surgery.  Combination of anesthesia, postoperative narcotics, change in appetite and fluid intake all can affect your bowels.  ° °YOU MUST use at least one of the following options; they are listed in order of increasing strength to get the job done.  They are all available over the counter, and you may need to use some, POSSIBLY even all of these options:   ° °Drink plenty of fluids (prune juice may be helpful) and  high fiber foods °Colace 100 mg by mouth twice a day  °Senokot for constipation as directed and as needed Dulcolax (bisacodyl), take with full glass of water  °Miralax (polyethylene glycol) once or twice a day as needed. ° °If you have tried all these things and are unable to have a bowel movement in the first 3-4 days after surgery call either your surgeon or your primary doctor.   ° °If you experience loose stools or diarrhea, hold the medications until you stool forms back up.  If your symptoms do not get better within 1 week or if they get worse, check with your doctor.  If you experience "the worst abdominal pain ever" or develop nausea or vomiting, please contact the office immediately for further recommendations for treatment. ° ° °ITCHING:  If you experience itching with your medications, try taking only a single pain pill, or even half a pain pill at a time.  You can also use Benadryl over the counter for itching or also to help with sleep.  ° °TED HOSE STOCKINGS:  Use stockings on both legs until for at least 2 weeks or as directed by physician office. They may be removed at night for sleeping. ° °MEDICATIONS:  See your medication summary on the “After Visit Summary” that nursing will review with you.  You may have some home medications which will be placed on hold until you complete the course of blood thinner medication.  It is important for you to complete the blood thinner medication as prescribed. ° °PRECAUTIONS:  If you experience chest pain or shortness of breath - call 911 immediately for transfer to the hospital emergency department.  ° °If you develop a fever greater that 101 F, purulent drainage from wound, increased redness or drainage from wound, foul odor from the wound/dressing, or calf pain - CONTACT YOUR SURGEON.   °                                                °FOLLOW-UP APPOINTMENTS:  If you do not already have a post-op appointment, please call the office for an appointment to be seen  by your surgeon.  Guidelines for how soon to be seen are listed in your “After Visit Summary”, but are typically between 1-4   weeks after surgery. ° °OTHER INSTRUCTIONS:  ° °Knee Replacement:  Do not place pillow under knee, focus on keeping the knee straight while resting. CPM instructions: 0-90 degrees, 2 hours in the morning, 2 hours in the afternoon, and 2 hours in the evening. Place foam block, curve side up under heel at all times except when in CPM or when walking.  DO NOT modify, tear, cut, or change the foam block in any way. ° °MAKE SURE YOU:  °• Understand these instructions.  °• Get help right away if you are not doing well or get worse.  ° ° °Thank you for letting us be a part of your medical care team.  It is a privilege we respect greatly.  We hope these instructions will help you stay on track for a fast and full recovery!  ° °

## 2016-12-10 NOTE — Progress Notes (Signed)
Patient ID: Gina Coffey, female   DOB: February 22, 1951, 66 y.o.   MRN: [id number]     Subjective:  Patient reports pain as mild.  Patient in the chair and in no acute distress.  Denies any CP or SOB  Objective:   VITALS:   Vitals:   12/09/16 1845 12/09/16 2022 12/10/16 0013 12/10/16 0523  BP: (!) 110/48 (!) 106/51 (!) 124/54 139/69  Pulse: 65 64 68 78  Resp:  15 15 17   Temp: 98.4 F (36.9 C) 98.4 F (36.9 C) 98 F (36.7 C) 98.4 F (36.9 C)  TempSrc: Oral Oral Oral Oral  SpO2: 98% 93% 98% 99%    ABD soft Sensation intact distally Dorsiflexion/Plantar flexion intact Incision: dressing C/D/I and no drainage   Lab Results  Component Value Date   WBC 11.3 (H) 12/10/2016   HGB 9.3 (L) 12/10/2016   HCT 29.3 (L) 12/10/2016   MCV 88.3 12/10/2016   PLT 187 12/10/2016   BMET    Component Value Date/Time   NA 136 12/10/2016 0314   K 4.4 12/10/2016 0314   CL 105 12/10/2016 0314   CO2 26 12/10/2016 0314   GLUCOSE 118 (H) 12/10/2016 0314   BUN 13 12/10/2016 0314   CREATININE 0.85 12/10/2016 0314   CALCIUM 8.4 (L) 12/10/2016 0314   GFRNONAA >60 12/10/2016 0314   GFRAA >60 12/10/2016 0314     Assessment/Plan: 1 Day Post-Op   Principal Problem:   Primary localized osteoarthritis of left knee Active Problems:   Severe obesity (BMI >= 40) (HCC)   S/P knee replacement   Advance diet Up with therapy  ABLA nonsymptomatic continue to monitor today Patient may request to go home today Okay to go if passes PT  WBAT Dry dressing PRN    DOUGLAS PARRY, BRANDON 12/10/2016, 8:11 AM  Discussed and spoke with patient.  Plan to stay overnight, patient currently lethargic, ABLA recheck in AM, not mobile enough yet for discharge.   BMI 48 also limiting recovery and risk.    Teryl Lucy, MD Cell [phone]

## 2016-12-10 NOTE — Care Management Note (Signed)
Case Management Note  Patient Details  Name: Samayah Manthei MRN: [id number] Date of Birth: 1950/08/24  Subjective/Objective:  From home alone, has family nearby, s/p TKR, will advance diet today, up with therapy.                  Action/Plan: NCM will follow for dc needs.  Expected Discharge Date:                  Expected Discharge Plan:     In-House Referral:     Discharge planning Services  CM Consult  Post Acute Care Choice:    Choice offered to:     DME Arranged:    DME Agency:     HH Arranged:    HH Agency:     Status of Service:  In process, will continue to follow  If discussed at Long Length of Stay Meetings, dates discussed:    Additional Comments:  Leone Haven, RN 12/10/2016, 11:07 AM

## 2016-12-10 NOTE — Evaluation (Signed)
Occupational Therapy Evaluation Patient Details Name: Gina Coffey MRN: [id number] DOB: 1950/05/05 Today's Date: 12/10/2016    History of Present Illness Pt is a 66 y.o. female now s/p L TKA on 12/09/16. Pertinent PMH includes HTN, arthritis, obesity, depression, anxiety, anemia.    Clinical Impression   This 66 y/o F presents with the above. At baseline Pt is mod independent with ADLs and functional mobility. Pt currently requires MinGuard for functional mobility at RW level, ModA for LB ADLs. Pt lives alone but will have initial 24 hr assist from family after return home with ADLs PRN. Questions answered and education provided throughout. Pt reports feeling comfortable completing ADLs and functional mobility after return home and with family assist. No additional OT needs identified at this time. Will sign off.     Follow Up Recommendations  No OT follow up;Supervision/Assistance - 24 hour (initially )    Equipment Recommendations  None recommended by OT           Precautions / Restrictions Precautions Precautions: Knee Restrictions Weight Bearing Restrictions: Yes LLE Weight Bearing: Weight bearing as tolerated      Mobility Bed Mobility               General bed mobility comments: Received sitting in chair. Indep to scoot to edge of chair  Transfers Overall transfer level: Needs assistance Equipment used: Rolling walker (2 wheeled) Transfers: Sit to/from Stand Sit to Stand: Min guard;Min assist         General transfer comment: initially MinA progressed to MinGuard, verbal cues for LE placement, demonstrates proper hand placement     Balance Overall balance assessment: Needs assistance Sitting-balance support: No upper extremity supported;Feet unsupported Sitting balance-Leahy Scale: Good     Standing balance support: During functional activity;Single extremity supported;Bilateral upper extremity supported Standing balance-Leahy Scale:  Fair Standing balance comment: Pt reliant on bil UE support during mobility, able to maintain static standing while completing grooming ADLs with close guard for safety                            ADL either performed or assessed with clinical judgement   ADL Overall ADL's : Needs assistance/impaired Eating/Feeding: Set up;Sitting   Grooming: Wash/dry hands;Oral care;Min guard;Standing   Upper Body Bathing: Min guard;Sitting   Lower Body Bathing: Minimal assistance;Sit to/from stand   Upper Body Dressing : Min guard;Sitting   Lower Body Dressing: Moderate assistance;Sit to/from stand   Toilet Transfer: Minimal assistance;Ambulation;BSC;RW Toilet Transfer Details (indicate cue type and reason): BSC over toilet  Toileting- Clothing Manipulation and Hygiene: Min guard;Sit to/from stand   Tub/ Shower Transfer: Walk-in shower;Minimal assistance;Cueing for sequencing;Shower seat;Ambulation;Rolling walker Tub/Shower Transfer Details (indicate cue type and reason): verbal cues for proper sequence/technique for transfer  Functional mobility during ADLs: Min guard;Rolling walker General ADL Comments: educated on compensatory techniques for completing ADLs and functional mobility transfers with Pt demonstrating and verbalizing good recall                          Pertinent Vitals/Pain Pain Assessment: No/denies pain     Hand Dominance Right   Extremity/Trunk Assessment Upper Extremity Assessment Upper Extremity Assessment: Overall WFL for tasks assessed   Lower Extremity Assessment Lower Extremity Assessment: Defer to PT evaluation       Communication Communication Communication: No difficulties   Cognition Arousal/Alertness: Awake/alert Behavior During Therapy: WFL for tasks assessed/performed Overall Cognitive  Status: Within Functional Limits for tasks assessed                                                      Home Living  Family/patient expects to be discharged to:: Private residence Living Arrangements: Alone Available Help at Discharge: Family;Available PRN/intermittently;Available 24 hours/day (daughter staying 24 hrs initially ) Type of Home: House Home Access: Stairs to enter [employer] of Steps: 3 Entrance Stairs-Rails: Right;Left Home Layout: Two level;Able to live on main level with bedroom/bathroom Alternate Level Stairs-Number of Steps: 12 Alternate Level Stairs-Rails: Left Bathroom Shower/Tub: Producer, television/film/video: Handicapped height     Home Equipment: Emergency planning/management officer - 2 wheels;Walker - 4 wheels;Bedside commode;Wheelchair - manual;Cane - single point          Prior Functioning/Environment Level of Independence: Independent with assistive device(s)        Comments: Mod indep with SPC at home or rollator for community amb secondary to L knee pain        OT Problem List: Decreased strength;Decreased activity tolerance;Decreased knowledge of use of DME or AE;Obesity            OT Goals(Current goals can be found in the care plan section) Acute Rehab OT Goals Patient Stated Goal: Return home safely OT Goal Formulation: With patient                                 AM-PAC PT "6 Clicks" Daily Activity     Outcome Measure Help from another person eating meals?: None Help from another person taking care of personal grooming?: A Little Help from another person toileting, which includes using toliet, bedpan, or urinal?: A Little Help from another person bathing (including washing, rinsing, drying)?: A Lot Help from another person to put on and taking off regular upper body clothing?: None Help from another person to put on and taking off regular lower body clothing?: A Lot 6 Click Score: 18   End of Session Equipment Utilized During Treatment: Gait belt;Rolling walker Nurse Communication: Mobility status  Activity Tolerance: Patient tolerated  treatment well Patient left: in chair;with call bell/phone within reach  OT Visit Diagnosis: Unsteadiness on feet (R26.81);Muscle weakness (generalized) (M62.81)                Time: 5621-3086 OT Time Calculation (min): 20 min Charges:  OT General Charges $OT Visit: 1 Procedure OT Evaluation $OT Eval Low Complexity: 1 Procedure G-Codes:     Marcy Siren, OT Pager [phone] 12/10/2016   [name] 12/10/2016, 3:48 PM

## 2016-12-10 NOTE — Evaluation (Signed)
Physical Therapy Evaluation Patient Details Name: Gina Coffey MRN: [id number] DOB: 03-27-1951 Today's Date: 12/10/2016   History of Present Illness  Pt is a 66 y.o. female now s/p L TKA on 12/09/16. Pertinent PMH includes HTN, arthritis, obesity, depression, anxiety, anemia.     Clinical Impression  Pt presents with decreased strength/ROM and an overall decrease in functional mobility secondary to above. PTA, pt lives at home alone; mod indep with [facility] or rollator secondary to knee pain. Pt has family nearby who can assist intermittently if needed. Educ on precautions, positioning, therex, and importance of mobility. Able to transfer and amb with RW and min guard. Pt would benefit from continued acute PT services to maximize functional mobility and independence.     Follow Up Recommendations DC plan and follow up therapy as arranged by surgeon    Equipment Recommendations  None recommended by PT    Recommendations for Other Services       Precautions / Restrictions Precautions Precautions: Knee Restrictions Weight Bearing Restrictions: Yes LLE Weight Bearing: Weight bearing as tolerated      Mobility  Bed Mobility               General bed mobility comments: Received sitting in chair. Indep to scoot to edge of chair  Transfers Overall transfer level: Needs assistance Equipment used: Rolling walker (2 wheeled) Transfers: Sit to/from Stand Sit to Stand: Min guard         General transfer comment: Stood with RW and min guard for balance; cues for technique with RW.  Ambulation/Gait Ambulation/Gait assistance: Min guard Ambulation Distance (Feet): 60 Feet Assistive device: Rolling walker (2 wheeled) Gait Pattern/deviations: Step-to pattern;Step-through pattern;Decreased stride length;Antalgic;Decreased weight shift to left Gait velocity: Decreased Gait velocity interpretation: <1.8 ft/sec, indicative of risk for recurrent falls General Gait Details: Amb  with RW and min guard for balance; cues for step-through pattern and increased weight shift to LLE as can tolerate  Stairs            Wheelchair Mobility    Modified Rankin (Stroke Patients Only)       Balance Overall balance assessment: Needs assistance Sitting-balance support: No upper extremity supported;Feet unsupported Sitting balance-Leahy Scale: Good     Standing balance support: During functional activity;Single extremity supported;Bilateral upper extremity supported Standing balance-Leahy Scale: Poor Standing balance comment: Reliant on single UE support for standing balance                             Pertinent Vitals/Pain Pain Assessment: No/denies pain    Home Living Family/patient expects to be discharged to:: Private residence Living Arrangements: Alone Available Help at Discharge: Family;Available PRN/intermittently Type of Home: House Home Access: Stairs to enter Entrance Stairs-Rails: Right;Left Entrance Stairs-Number of Steps: 3 Home Layout: Two level;Able to live on main level with bedroom/bathroom Home Equipment: Shower seat;Walker - 2 wheels;Walker - 4 wheels;Bedside commode;Wheelchair - manual;Cane - single point      Prior Function Level of Independence: Independent with assistive device(s)         Comments: Mod indep with SPC at home or rollator for community amb secondary to L knee pain     Hand Dominance   Dominant Hand: Right    Extremity/Trunk Assessment   Upper Extremity Assessment Upper Extremity Assessment: Overall WFL for tasks assessed    Lower Extremity Assessment Lower Extremity Assessment: LLE deficits/detail LLE Deficits / Details: L hip flexion 3/5  Communication   Communication: No difficulties  Cognition Arousal/Alertness: Awake/alert Behavior During Therapy: WFL for tasks assessed/performed Overall Cognitive Status: Within Functional Limits for tasks assessed                                         General Comments      Exercises Total Joint Exercises Ankle Circles/Pumps: AROM;Both;10 reps;Seated Quad Sets: Strengthening;Left;5 reps;Seated Heel Slides: AAROM;Left;5 reps;Seated Straight Leg Raises: AAROM;Left;5 reps;Seated   Assessment/Plan    PT Assessment Patient needs continued PT services  PT Problem List Decreased strength;Decreased range of motion;Decreased activity tolerance;Decreased balance;Decreased mobility;Decreased knowledge of use of DME;Decreased safety awareness;Decreased knowledge of precautions;Pain       PT Treatment Interventions DME instruction;Gait training;Stair training;Functional mobility training;Therapeutic activities;Therapeutic exercise;Balance training;Patient/family education    PT Goals (Current goals can be found in the Care Plan section)  Acute Rehab PT Goals Patient Stated Goal: Return home safely PT Goal Formulation: With patient Time For Goal Achievement: 12/24/16 Potential to Achieve Goals: Good    Frequency 7X/week   Barriers to discharge Decreased caregiver support      Co-evaluation               AM-PAC PT "6 Clicks" Daily Activity  Outcome Measure Difficulty turning over in bed (including adjusting bedclothes, sheets and blankets)?: A Little Difficulty moving from lying on back to sitting on the side of the bed? : A Little Difficulty sitting down on and standing up from a chair with arms (e.g., wheelchair, bedside commode, etc,.)?: A Little Help needed moving to and from a bed to chair (including a wheelchair)?: A Little Help needed walking in hospital room?: A Little Help needed climbing 3-5 steps with a railing? : A Little 6 Click Score: 18    End of Session Equipment Utilized During Treatment: Gait belt Activity Tolerance: Patient tolerated treatment well Patient left: in chair;with call bell/phone within reach Nurse Communication: Mobility status;Other (comment) (Bleeding at LLE  surgical site) PT Visit Diagnosis: Other abnormalities of gait and mobility (R26.89)    Time: 7062-3762 PT Time Calculation (min) (ACUTE ONLY): 29 min   Charges:   PT Evaluation $PT Eval Moderate Complexity: 1 Mod PT Treatments $Therapeutic Exercise: 8-22 mins   PT G Codes:       Ina Homes, PT, DPT [phone] Acute Rehab  Malachy Chamber 12/10/2016, 10:06 AM

## 2016-12-10 NOTE — Anesthesia Postprocedure Evaluation (Signed)
Anesthesia Post Note  Patient: Gina Coffey  Procedure(s) Performed: Procedure(s) (LRB): TOTAL KNEE ARTHROPLASTY (Left)     Patient location during evaluation: PACU Anesthesia Type: MAC, Spinal and Regional Level of consciousness: awake and alert Pain management: pain level controlled Vital Signs Assessment: post-procedure vital signs reviewed and stable Respiratory status: spontaneous breathing and respiratory function stable Cardiovascular status: blood pressure returned to baseline and stable Postop Assessment: spinal receding Anesthetic complications: no    Last Vitals:  Vitals:   12/10/16 0013 12/10/16 0523  BP: (!) 124/54 139/69  Pulse: 68 78  Resp: 15 17  Temp: 36.7 C 36.9 C  SpO2: 98% 99%    Last Pain:  Vitals:   12/10/16 0602  TempSrc:   PainSc: 3                  Nolon Nations

## 2016-12-10 NOTE — Progress Notes (Signed)
Physical Therapy Treatment Patient Details Name: Gina Coffey MRN: [id number] DOB: 1950-06-26 Today's Date: 12/10/2016    History of Present Illness Pt is a 66 y.o. female now s/p L TKA on 12/09/16. Pertinent PMH includes HTN, arthritis, obesity, depression, anxiety, anemia.    PT Comments    Pt progressing well with mobility, remains motivated to participate with PT. Able to transfer and amb with RW and min guard for balance. Reviewed therex, positioning, and precautions. Daughter present throughout session and very supportive. Plan for stair training and car transfer simulation next treatment session. Will continue to follow acutely.   Follow Up Recommendations  DC plan and follow up therapy as arranged by surgeon;Home health PT     Equipment Recommendations  None recommended by PT    Recommendations for Other Services       Precautions / Restrictions Precautions Precautions: Knee Precaution Comments: Reviewed all knee precautions Restrictions Weight Bearing Restrictions: Yes LLE Weight Bearing: Weight bearing as tolerated    Mobility  Bed Mobility               General bed mobility comments: Received sitting in chair. Indep to scoot to edge of chair  Transfers Overall transfer level: Needs assistance Equipment used: Rolling walker (2 wheeled) Transfers: Sit to/from Stand Sit to Stand: Min guard         General transfer comment: Stood with RW and min guard for balance; no cues required for technique with RW  Ambulation/Gait Ambulation/Gait assistance: Min guard Ambulation Distance (Feet): 150 Feet Assistive device: Rolling walker (2 wheeled) Gait Pattern/deviations: Step-through pattern;Decreased stride length;Antalgic;Decreased weight shift to right Gait velocity: Decreased Gait velocity interpretation: <1.8 ft/sec, indicative of risk for recurrent falls General Gait Details: Amb with RW and min guard for balance; increased gait speed, cues for  heel-to-toe gait pattern   Stairs            Wheelchair Mobility    Modified Rankin (Stroke Patients Only)       Balance Overall balance assessment: Needs assistance Sitting-balance support: No upper extremity supported;Feet unsupported Sitting balance-Leahy Scale: Good     Standing balance support: During functional activity;Single extremity supported;Bilateral upper extremity supported Standing balance-Leahy Scale: Fair Standing balance comment: Able to stand with no UE support; reliant on BUE support for mobility                            Cognition Arousal/Alertness: Awake/alert Behavior During Therapy: WFL for tasks assessed/performed Overall Cognitive Status: Within Functional Limits for tasks assessed                                        Exercises Total Joint Exercises Ankle Circles/Pumps: AROM;Both;10 reps;Seated Quad Sets: Strengthening;Left;5 reps;Seated Short Arc Quad: Strengthening;Left;5 reps;Seated Heel Slides: AAROM;Left;5 reps;Seated Straight Leg Raises: AAROM;Left;5 reps;Seated Goniometric ROM: L knee flexion grossly 80'    General Comments General comments (skin integrity, edema, etc.): Daughter present throughout session      Pertinent Vitals/Pain Pain Assessment: Faces Faces Pain Scale: Hurts a little bit Pain Location: L knee Pain Descriptors / Indicators: Aching;Grimacing Pain Intervention(s): Limited activity within patient's tolerance;Monitored during session;Premedicated before session;Repositioned    Home Living Family/patient expects to be discharged to:: Private residence Living Arrangements: Alone Available Help at Discharge: Family;Available PRN/intermittently;Available 24 hours/day (daughter staying 24 hrs initially ) Type of Home: House  Home Access: Stairs to enter Entrance Stairs-Rails: Right;Left Home Layout: Two level;Able to live on main level with bedroom/bathroom Home Equipment: Shower  seat;Walker - 2 wheels;Walker - 4 wheels;Bedside commode;Wheelchair - manual;Cane - single point      Prior Function Level of Independence: Independent with assistive device(s)      Comments: Mod indep with SPC at home or rollator for community amb secondary to L knee pain   PT Goals (current goals can now be found in the care plan section) Acute Rehab PT Goals Patient Stated Goal: Return home safely PT Goal Formulation: With patient Time For Goal Achievement: 12/24/16 Potential to Achieve Goals: Good Progress towards PT goals: Progressing toward goals    Frequency    7X/week      PT Plan Current plan remains appropriate    Co-evaluation              AM-PAC PT "6 Clicks" Daily Activity  Outcome Measure  Difficulty turning over in bed (including adjusting bedclothes, sheets and blankets)?: A Little Difficulty moving from lying on back to sitting on the side of the bed? : A Little Difficulty sitting down on and standing up from a chair with arms (e.g., wheelchair, bedside commode, etc,.)?: A Little Help needed moving to and from a bed to chair (including a wheelchair)?: A Little Help needed walking in hospital room?: A Little Help needed climbing 3-5 steps with a railing? : A Little 6 Click Score: 18    End of Session Equipment Utilized During Treatment: Gait belt Activity Tolerance: Patient tolerated treatment well Patient left: in chair;with call bell/phone within reach;with family/visitor present Nurse Communication: Mobility status PT Visit Diagnosis: Other abnormalities of gait and mobility (R26.89)     Time: 9604-5409 PT Time Calculation (min) (ACUTE ONLY): 18 min  Charges:  $Gait Training: 8-22 mins                    G Codes:      Ina Homes, PT, DPT [phone] Acute Rehab  Malachy Chamber 12/10/2016, 4:36 PM

## 2016-12-11 LAB — CBC
HEMATOCRIT: 29.1 % — AB (ref 36.0–46.0)
HEMOGLOBIN: 9.5 g/dL — AB (ref 12.0–15.0)
MCH: 28.5 pg (ref 26.0–34.0)
MCHC: 32.6 g/dL (ref 30.0–36.0)
MCV: 87.4 fL (ref 78.0–100.0)
Platelets: 207 10*3/uL (ref 150–400)
RBC: 3.33 MIL/uL — ABNORMAL LOW (ref 3.87–5.11)
RDW: 13.1 % (ref 11.5–15.5)
WBC: 19.3 10*3/uL — AB (ref 4.0–10.5)

## 2016-12-11 NOTE — Progress Notes (Signed)
Patient ID: Gina Coffey, female   DOB: [birth year], 66 y.o.   MRN: [id number]     Subjective:  Patient reports pain as mild.  Patient in bed and in no acute distress.  Denies CP or SOB  Objective:   VITALS:   Vitals:   12/10/16 0013 12/10/16 0523 12/10/16 1810 12/10/16 2120  BP: (!) 124/54 139/69 (!) 139/55 (!) 136/54  Pulse: 68 78 84 76  Resp: 15 17  17   Temp: 98 F (36.7 C) 98.4 F (36.9 C) 98.9 F (37.2 C) 98.6 F (37 C)  TempSrc: Oral Oral Oral Oral  SpO2: 98% 99% 94% 94%    ABD soft Sensation intact distally Dorsiflexion/Plantar flexion intact Incision: dressing C/D/I and no drainage Dressing changed  Lab Results  Component Value Date   WBC 19.3 (H) 12/11/2016   HGB 9.5 (L) 12/11/2016   HCT 29.1 (L) 12/11/2016   MCV 87.4 12/11/2016   PLT 207 12/11/2016   BMET    Component Value Date/Time   NA 136 12/10/2016 0314   K 4.4 12/10/2016 0314   CL 105 12/10/2016 0314   CO2 26 12/10/2016 0314   GLUCOSE 118 (H) 12/10/2016 0314   BUN 13 12/10/2016 0314   CREATININE 0.85 12/10/2016 0314   CALCIUM 8.4 (L) 12/10/2016 0314   GFRNONAA >60 12/10/2016 0314   GFRAA >60 12/10/2016 0314     Assessment/Plan: 2 Days Post-Op   Principal Problem:   Primary localized osteoarthritis of left knee Active Problems:   Severe obesity (BMI >= 40) (HCC)   S/P knee replacement   Advance diet Up with therapy Discharge home with home health WBAT Dry dressing PRN Follow up as scheduled with Dr Jenell Milliner, Apolinar Junes 12/11/2016, 6:48 AM  Discussed and agree with above.   Teryl Lucy, MD Cell [phone]

## 2016-12-11 NOTE — Care Management Note (Signed)
Case Management Note  Patient Details  Name: Gina Coffey MRN: [id number] Date of Birth: [birth year]  Subjective/Objective:       66 yr old female s/p left total knee arthroplasty.           Action/Plan:  Patient was preoperatively setup with [facility], no changes. Patient was discharged prior to CM speaking with her. CM contacted Cordelia Pen at Dr. Shelba Flake office to confirm. Patient has all necessary DME, has support at discharge.   Expected Discharge Date:  12/11/16               Expected Discharge Plan:  Home w Home Health Services  In-House Referral:  NA  Discharge planning Services  CM Consult  Post Acute Care Choice:  Home Health Choice offered to:     DME Arranged:   (has RW and 3in1) DME Agency:  NA  HH Arranged:  PT HH Agency:  [facility] (formerly [facility]  Status of Service:  Completed, signed off  If discussed at [employer], dates discussed:    Additional Comments:  Durenda Guthrie, RN 12/11/2016, 2:09 PM

## 2016-12-11 NOTE — Progress Notes (Signed)
Physical Therapy Treatment Patient Details Name: Gina Coffey MRN: [id number] DOB: 1950/06/15 Today's Date: 12/11/2016    History of Present Illness Pt is a 66 y.o. female now s/p L TKA on 12/09/16. Pertinent PMH includes HTN, arthritis, obesity, depression, anxiety, anemia.    PT Comments    Pt progressing well with mobility and has achieved all acute PT goals. Therex reviewed and all educ completed; pt has no further questions. Mod indep for bed mob, transfers and amb with RW. Supervision to ascend/descend steps with L-side rail. Simulated car transfer due to expected d/c today. Still feel pt would benefit from HHPT to maximize functional mobility and independence at home. Will d/c acute PT.     Follow Up Recommendations  DC plan and follow up therapy as arranged by surgeon;Home health PT     Equipment Recommendations  None recommended by PT    Recommendations for Other Services       Precautions / Restrictions Precautions Precautions: Knee Precaution Comments: Reviewed all knee precautions Restrictions Weight Bearing Restrictions: Yes LLE Weight Bearing: Weight bearing as tolerated    Mobility  Bed Mobility Overal bed mobility: Modified Independent             General bed mobility comments: Received sitting in chair. Pt reports mod indep with bed mob, able to get into chair herself this morning using RLE to assist LLE in/out of bed  Transfers Overall transfer level: Modified independent Equipment used: Rolling walker (2 wheeled) Transfers: Sit to/from Stand Sit to Stand: Modified independent (Device/Increase time)         General transfer comment: No cues needed for technique  Ambulation/Gait Ambulation/Gait assistance: Modified independent (Device/Increase time) Ambulation Distance (Feet): 450 Feet Assistive device: Rolling walker (2 wheeled) Gait Pattern/deviations: Step-through pattern;Decreased weight shift to left Gait velocity:  Decreased Gait velocity interpretation: <1.8 ft/sec, indicative of risk for recurrent falls General Gait Details: Amb mod indep with RW; improved gait speed and no cues needed for technique. Required 1x seated rest break secondary to fatigue   Stairs Stairs: Yes   Stair Management: One rail Left;Step to pattern;Sideways Number of Stairs: 3 General stair comments: Educ on technique for ascend/descending stairs; pt reports will have initial supervision when entering/exiting home on stairs  Wheelchair Mobility    Modified Rankin (Stroke Patients Only)       Balance                                            Cognition Arousal/Alertness: Awake/alert Behavior During Therapy: WFL for tasks assessed/performed Overall Cognitive Status: Within Functional Limits for tasks assessed                                        Exercises Total Joint Exercises Ankle Circles/Pumps: AROM;Both;10 reps;Seated Quad Sets: Strengthening;Left;5 reps;Seated Short Arc Quad: Strengthening;Left;5 reps;Seated Heel Slides: AAROM;Left;5 reps;Seated Straight Leg Raises: AAROM;Left;Seated;10 reps Goniometric ROM: L knee flexion grossly 90'    General Comments        Pertinent Vitals/Pain Pain Assessment: 0-10 Pain Score: 2  Pain Location: L knee Pain Descriptors / Indicators: Discomfort;Dull Pain Intervention(s): Limited activity within patient's tolerance;Monitored during session    Home Living  Prior Function            PT Goals (current goals can now be found in the care plan section) Acute Rehab PT Goals Patient Stated Goal: Return home safely PT Goal Formulation: With patient Time For Goal Achievement: 12/24/16 Potential to Achieve Goals: Good Progress towards PT goals: Goals met/education completed, patient discharged from PT    Frequency    7X/week      PT Plan Current plan remains appropriate    Co-evaluation               AM-PAC PT "6 Clicks" Daily Activity  Outcome Measure  Difficulty turning over in bed (including adjusting bedclothes, sheets and blankets)?: None Difficulty moving from lying on back to sitting on the side of the bed? : None Difficulty sitting down on and standing up from a chair with arms (e.g., wheelchair, bedside commode, etc,.)?: None Help needed moving to and from a bed to chair (including a wheelchair)?: None Help needed walking in hospital room?: None Help needed climbing 3-5 steps with a railing? : A Little 6 Click Score: 23    End of Session Equipment Utilized During Treatment: Gait belt Activity Tolerance: Patient tolerated treatment well Patient left: in chair;with call bell/phone within reach Nurse Communication: Mobility status PT Visit Diagnosis: Other abnormalities of gait and mobility (R26.89)     Time: 2957-4734 PT Time Calculation (min) (ACUTE ONLY): 24 min  Charges:  $Gait Training: 8-22 mins $Therapeutic Exercise: 8-22 mins                    G Codes:      Mabeline Caras, PT, DPT [phone] Acute Rehab  Derry Lory 12/11/2016, 9:16 AM

## 2016-12-11 NOTE — Discharge Summary (Signed)
Physician Discharge Summary  Patient ID: Gina Coffey MRN: [id number] DOB/AGE: 67-May-1952 67 y.o.  Admit date: 12/09/2016 Discharge date: 12/11/2016  Admission Diagnoses:  Primary localized osteoarthritis of left knee  Discharge Diagnoses:  Principal Problem:   Primary localized osteoarthritis of left knee Active Problems:   Severe obesity (BMI >= 40) (HCC)   S/P knee replacement   Past Medical History:  Diagnosis Date  . Anemia   . Anxiety   . Arthritis   . Depression   . Dysrhythmia    atrial fibrillation  . Hypertension   . Pneumonia    in 2006  . Primary localized osteoarthritis of left knee 12/09/2016  . Severe obesity (BMI >= 40) (HCC) 12/09/2016    Surgeries: Procedure(s): TOTAL KNEE ARTHROPLASTY on 12/09/2016   Consultants (if any):   Discharged Condition: Improved  Hospital Course: Gina Coffey is an 66 y.o. female who was admitted 12/09/2016 with a diagnosis of Primary localized osteoarthritis of left knee and went to the operating room on 12/09/2016 and underwent the above named procedures.    She was given perioperative antibiotics:  Anti-infectives    Start     Dose/Rate Route Frequency Ordered Stop   12/09/16 1900  ceFAZolin (ANCEF) IVPB 2g/100 mL premix     2 g 200 mL/hr over 30 Minutes Intravenous Every 6 hours 12/09/16 1804 12/10/16 0121   12/09/16 1045  ceFAZolin (ANCEF) 3 g in dextrose 5 % 50 mL IVPB     3 g 130 mL/hr over 30 Minutes Intravenous On call to O.R. 12/08/16 6440 12/09/16 1156    .  She was given sequential compression devices, early ambulation, and xarelto for DVT prophylaxis.  She benefited maximally from the hospital stay and there were no complications.    Recent vital signs:  Vitals:   12/10/16 2120 12/11/16 0654  BP: (!) 136/54 (!) 129/50  Pulse: 76 72  Resp: 17 17  Temp: 98.6 F (37 C) 98.1 F (36.7 C)  SpO2: 94% 97%    Recent laboratory studies:  Lab Results  Component Value Date   HGB 9.5  (L) 12/11/2016   HGB 9.3 (L) 12/10/2016   HGB 13.5 11/27/2016   Lab Results  Component Value Date   WBC 19.3 (H) 12/11/2016   PLT 207 12/11/2016   Lab Results  Component Value Date   INR 1.04 12/09/2016   Lab Results  Component Value Date   NA 136 12/10/2016   K 4.4 12/10/2016   CL 105 12/10/2016   CO2 26 12/10/2016   BUN 13 12/10/2016   CREATININE 0.85 12/10/2016   GLUCOSE 118 (H) 12/10/2016    Discharge Medications:   Allergies as of 12/11/2016      Reactions   Onion Itching, Swelling, Rash      Medication List    TAKE these medications   acetaminophen 500 MG tablet Commonly known as:  TYLENOL Take 1,000 mg by mouth daily.   baclofen 10 MG tablet Commonly known as:  LIORESAL Take 1 tablet (10 mg total) by mouth 3 (three) times daily. As needed for muscle spasm   benazepril-hydrochlorthiazide 20-12.5 MG tablet Commonly known as:  LOTENSIN HCT Take 1 tablet by mouth daily.   cetirizine 10 MG tablet Commonly known as:  ZYRTEC Take 10 mg by mouth daily.   cholecalciferol 1000 units tablet Commonly known as:  VITAMIN D Take 1,000 Units by mouth daily. 25 mcg   ELIQUIS 5 MG Tabs tablet Generic drug:  apixaban Take 1  tablet (5 mg total) by mouth 2 (two) times daily. Patient needs appointment for future refills. What changed:  Another medication with the same name was removed. Continue taking this medication, and follow the directions you see here.   EPIPEN 2-PAK 0.3 mg/0.3 mL Soaj injection Generic drug:  EPINEPHrine Inject 0.3 mg into the muscle daily as needed (for anaphylactic allergic reactions).   Fish Oil 1000 MG Caps Take 1,000 mg by mouth daily.   ondansetron 4 MG tablet Commonly known as:  ZOFRAN Take 1 tablet (4 mg total) by mouth every 8 (eight) hours as needed for nausea or vomiting.   oxyCODONE 5 MG immediate release tablet Commonly known as:  ROXICODONE Take 1 tablet (5 mg total) by mouth every 4 (four) hours as needed for severe pain.    rosuvastatin 20 MG tablet Commonly known as:  CRESTOR Take 20 mg by mouth every evening.   sennosides-docusate sodium 8.6-50 MG tablet Commonly known as:  SENOKOT-S Take 2 tablets by mouth daily.   sertraline 50 MG tablet Commonly known as:  ZOLOFT Take 50 mg by mouth daily.   traMADol 50 MG tablet Commonly known as:  ULTRAM Take 50 mg by mouth every 6 (six) hours as needed (for pain.).   vitamin E 400 UNIT capsule Take 400 Units by mouth daily.            Discharge Care Instructions        Start     Ordered   12/09/16 0000  ELIQUIS 5 MG TABS tablet  2 times daily     12/09/16 1353   12/09/16 0000  ondansetron (ZOFRAN) 4 MG tablet  Every 8 hours PRN     12/09/16 1353   12/09/16 0000  baclofen (LIORESAL) 10 MG tablet  3 times daily     12/09/16 1353   12/09/16 0000  sennosides-docusate sodium (SENOKOT-S) 8.6-50 MG tablet  Daily     12/09/16 1353   12/09/16 0000  oxyCODONE (ROXICODONE) 5 MG immediate release tablet  Every 4 hours PRN     12/09/16 1353      Diagnostic Studies: Dg Knee Left Port  Result Date: 12/09/2016 CLINICAL DATA:  LEFT total knee arthroplasty EXAM: PORTABLE LEFT KNEE - 1-2 VIEW COMPARISON:  None. FINDINGS: LEFT total knee arthroplasty. Postsurgical gas tissue anterior to the the joint. No complicating features. IMPRESSION: No complication following total knee arthroplasty Electronically Signed   By: Genevive Bi M.D.   On: 12/09/2016 15:29    Disposition: Final discharge disposition not confirmed    Follow-up Information    Teryl Lucy, MD. Schedule an appointment as soon as possible for a visit in 2 weeks.   Specialty:  Orthopedic Surgery Contact information: [address] [place] [address] [phone]            Signed: Eulas Post 12/11/2016, 8:10 AM

## 2016-12-13 DIAGNOSIS — I10 Essential (primary) hypertension: Secondary | ICD-10-CM | POA: Diagnosis not present

## 2016-12-13 DIAGNOSIS — Z7901 Long term (current) use of anticoagulants: Secondary | ICD-10-CM | POA: Diagnosis not present

## 2016-12-13 DIAGNOSIS — Z96652 Presence of left artificial knee joint: Secondary | ICD-10-CM | POA: Diagnosis not present

## 2016-12-13 DIAGNOSIS — F419 Anxiety disorder, unspecified: Secondary | ICD-10-CM | POA: Diagnosis not present

## 2016-12-13 DIAGNOSIS — Z8701 Personal history of pneumonia (recurrent): Secondary | ICD-10-CM | POA: Diagnosis not present

## 2016-12-13 DIAGNOSIS — F329 Major depressive disorder, single episode, unspecified: Secondary | ICD-10-CM | POA: Diagnosis not present

## 2016-12-13 DIAGNOSIS — Z6841 Body Mass Index (BMI) 40.0 and over, adult: Secondary | ICD-10-CM | POA: Diagnosis not present

## 2016-12-13 DIAGNOSIS — Z9181 History of falling: Secondary | ICD-10-CM | POA: Diagnosis not present

## 2016-12-13 DIAGNOSIS — Z471 Aftercare following joint replacement surgery: Secondary | ICD-10-CM | POA: Diagnosis not present

## 2016-12-13 DIAGNOSIS — I4891 Unspecified atrial fibrillation: Secondary | ICD-10-CM | POA: Diagnosis not present

## 2016-12-24 DIAGNOSIS — I10 Essential (primary) hypertension: Secondary | ICD-10-CM | POA: Diagnosis not present

## 2016-12-24 DIAGNOSIS — Z7901 Long term (current) use of anticoagulants: Secondary | ICD-10-CM | POA: Diagnosis not present

## 2016-12-24 DIAGNOSIS — I4891 Unspecified atrial fibrillation: Secondary | ICD-10-CM | POA: Diagnosis not present

## 2016-12-24 DIAGNOSIS — Z8701 Personal history of pneumonia (recurrent): Secondary | ICD-10-CM | POA: Diagnosis not present

## 2016-12-24 DIAGNOSIS — Z96652 Presence of left artificial knee joint: Secondary | ICD-10-CM | POA: Diagnosis not present

## 2016-12-24 DIAGNOSIS — Z471 Aftercare following joint replacement surgery: Secondary | ICD-10-CM | POA: Diagnosis not present

## 2016-12-24 DIAGNOSIS — M1712 Unilateral primary osteoarthritis, left knee: Secondary | ICD-10-CM | POA: Diagnosis not present

## 2016-12-24 DIAGNOSIS — F329 Major depressive disorder, single episode, unspecified: Secondary | ICD-10-CM | POA: Diagnosis not present

## 2016-12-24 DIAGNOSIS — Z6841 Body Mass Index (BMI) 40.0 and over, adult: Secondary | ICD-10-CM | POA: Diagnosis not present

## 2016-12-24 DIAGNOSIS — Z9181 History of falling: Secondary | ICD-10-CM | POA: Diagnosis not present

## 2016-12-24 DIAGNOSIS — F419 Anxiety disorder, unspecified: Secondary | ICD-10-CM | POA: Diagnosis not present

## 2016-12-31 DIAGNOSIS — M25562 Pain in left knee: Secondary | ICD-10-CM | POA: Diagnosis not present

## 2016-12-31 DIAGNOSIS — R262 Difficulty in walking, not elsewhere classified: Secondary | ICD-10-CM | POA: Diagnosis not present

## 2017-01-02 DIAGNOSIS — M1712 Unilateral primary osteoarthritis, left knee: Secondary | ICD-10-CM | POA: Diagnosis not present

## 2017-01-05 DIAGNOSIS — R262 Difficulty in walking, not elsewhere classified: Secondary | ICD-10-CM | POA: Diagnosis not present

## 2017-01-05 DIAGNOSIS — M25562 Pain in left knee: Secondary | ICD-10-CM | POA: Diagnosis not present

## 2017-01-07 DIAGNOSIS — M25562 Pain in left knee: Secondary | ICD-10-CM | POA: Diagnosis not present

## 2017-01-07 DIAGNOSIS — R262 Difficulty in walking, not elsewhere classified: Secondary | ICD-10-CM | POA: Diagnosis not present

## 2017-01-13 DIAGNOSIS — R262 Difficulty in walking, not elsewhere classified: Secondary | ICD-10-CM | POA: Diagnosis not present

## 2017-01-13 DIAGNOSIS — M25562 Pain in left knee: Secondary | ICD-10-CM | POA: Diagnosis not present

## 2017-01-16 DIAGNOSIS — R262 Difficulty in walking, not elsewhere classified: Secondary | ICD-10-CM | POA: Diagnosis not present

## 2017-01-16 DIAGNOSIS — M25562 Pain in left knee: Secondary | ICD-10-CM | POA: Diagnosis not present

## 2017-01-21 DIAGNOSIS — M1712 Unilateral primary osteoarthritis, left knee: Secondary | ICD-10-CM | POA: Diagnosis not present

## 2017-03-04 DIAGNOSIS — M1712 Unilateral primary osteoarthritis, left knee: Secondary | ICD-10-CM | POA: Diagnosis not present

## 2017-03-18 DIAGNOSIS — E785 Hyperlipidemia, unspecified: Secondary | ICD-10-CM | POA: Diagnosis not present

## 2017-03-18 DIAGNOSIS — I480 Paroxysmal atrial fibrillation: Secondary | ICD-10-CM | POA: Diagnosis not present

## 2017-03-18 DIAGNOSIS — Z79899 Other long term (current) drug therapy: Secondary | ICD-10-CM | POA: Diagnosis not present

## 2017-03-18 DIAGNOSIS — I10 Essential (primary) hypertension: Secondary | ICD-10-CM | POA: Diagnosis not present

## 2017-04-27 ENCOUNTER — Other Ambulatory Visit: Payer: Self-pay | Admitting: Internal Medicine

## 2017-04-27 DIAGNOSIS — Z1231 Encounter for screening mammogram for malignant neoplasm of breast: Secondary | ICD-10-CM

## 2017-04-30 DIAGNOSIS — L820 Inflamed seborrheic keratosis: Secondary | ICD-10-CM | POA: Diagnosis not present

## 2017-05-13 DIAGNOSIS — J0100 Acute maxillary sinusitis, unspecified: Secondary | ICD-10-CM | POA: Diagnosis not present

## 2017-05-13 DIAGNOSIS — J205 Acute bronchitis due to respiratory syncytial virus: Secondary | ICD-10-CM | POA: Diagnosis not present

## 2017-05-18 ENCOUNTER — Ambulatory Visit
Admission: RE | Admit: 2017-05-18 | Discharge: 2017-05-18 | Disposition: A | Discharge status: Home / Self Care | Payer: PPO | Source: Ambulatory Visit | Attending: Internal Medicine | Admitting: Internal Medicine

## 2017-05-18 DIAGNOSIS — Z1231 Encounter for screening mammogram for malignant neoplasm of breast: Secondary | ICD-10-CM

## 2017-08-03 DIAGNOSIS — I480 Paroxysmal atrial fibrillation: Secondary | ICD-10-CM | POA: Diagnosis not present

## 2017-08-03 DIAGNOSIS — R000 Tachycardia, unspecified: Secondary | ICD-10-CM | POA: Diagnosis not present

## 2017-08-03 DIAGNOSIS — R079 Chest pain, unspecified: Secondary | ICD-10-CM | POA: Diagnosis not present

## 2017-08-03 DIAGNOSIS — I4891 Unspecified atrial fibrillation: Secondary | ICD-10-CM | POA: Diagnosis not present

## 2017-08-03 DIAGNOSIS — Z7901 Long term (current) use of anticoagulants: Secondary | ICD-10-CM | POA: Diagnosis not present

## 2017-08-05 DIAGNOSIS — R002 Palpitations: Secondary | ICD-10-CM

## 2017-08-05 DIAGNOSIS — E7800 Pure hypercholesterolemia, unspecified: Secondary | ICD-10-CM | POA: Insufficient documentation

## 2017-08-05 DIAGNOSIS — M1712 Unilateral primary osteoarthritis, left knee: Secondary | ICD-10-CM | POA: Diagnosis not present

## 2017-08-05 DIAGNOSIS — I119 Hypertensive heart disease without heart failure: Secondary | ICD-10-CM | POA: Insufficient documentation

## 2017-08-05 DIAGNOSIS — M1711 Unilateral primary osteoarthritis, right knee: Secondary | ICD-10-CM | POA: Diagnosis not present

## 2017-08-05 HISTORY — DX: Pure hypercholesterolemia, unspecified: E78.00

## 2017-08-05 HISTORY — DX: Hypertensive heart disease without heart failure: I11.9

## 2017-08-05 HISTORY — DX: Palpitations: R00.2

## 2017-08-10 DIAGNOSIS — Z7901 Long term (current) use of anticoagulants: Secondary | ICD-10-CM | POA: Insufficient documentation

## 2017-08-10 HISTORY — DX: Long term (current) use of anticoagulants: Z79.01

## 2017-08-10 NOTE — Progress Notes (Signed)
Cardiology Office Note:    Date:  08/12/2017   ID:  Gina Coffey, DOB Sep 14, 1950, MRN [id number]  PCP:  Philemon Kingdom, MD  Cardiologist:  Norman Herrlich, MD    Referring MD: Philemon Kingdom, MD    ASSESSMENT:    1. PAF (paroxysmal atrial fibrillation) (HCC)   2. Hypertensive heart disease without heart failure   3. Chronic anticoagulation    PLAN:    In order of problems listed above:  1. She has had her second episode of atrial fibrillation after 2-1/2-year hiatus which lasted for approximately 4 hours and terminated in the emergency room after IV Cardizem.  There is no obvious precipitant.  We discussed the alternative of antiarrhythmic drug therapy referral to electrophysiology or watchful waiting while anticoagulated and she prefers the latter.  If having frequent episodes and antiarrhythmic drug would be appropriate.  She will continue anticoagulation I asked her to screen her heart rate daily with a home blood pressure device.  She will take her rate limiting calcium channel blocker as needed if rates are greater than 120 and not daily. 2. Stable continue current treatment 3. Stable continue her anticoagulant she is compliant and has had no bleeding complication   Next appointment: 6 months   Medication Adjustments/Labs and Tests Ordered: Current medicines are reviewed at length with the patient today.  Concerns regarding medicines are outlined above.  Orders Placed This Encounter  Procedures  . EKG 12-Lead   Meds ordered this encounter  Medications  . diltiazem (CARDIZEM CD) 120 MG 24 hr capsule    Sig: Take 1 capsule (120 mg total) by mouth as needed. For heart rate greater than 120.    Dispense:  90 capsule    Refill:  3    Chief Complaint  Patient presents with  . Follow-up  . Atrial Fibrillation    History of Present Illness:    Gina Coffey is a 67 y.o. female with a hx of paroxysmal atrial fibrillation, anticoagulation and  hypertension last seen 03/03/16. She had recurrent atrial fibrillation was seen in the emergency room at [facility] 08/03/2017 rate was rapid at 1 4150/min she is giving beta-blocker for rate control continued on anticoagulant and discharged home.  2 troponins were normal CBC and CMP were normal. Compliance with diet, lifestyle and medications: Yes Since discharge from the ED there is been no palpitation chest pain shortness of breath TIA or bleeding complication of her anticoagulant.  Although I could not tell from the ED record she told me that she converted prior to discharge from the hospital and was given a prescription for oral Cardizem but was uncertain whether or not to take it and what parameters to use. Past Medical History:  Diagnosis Date  . Anemia   . Anxiety   . Arthritis   . Depression   . Dysrhythmia    atrial fibrillation  . Hypercholesteremia 08/05/2017  . Hypertension   . PAF (paroxysmal atrial fibrillation) (HCC) 04/29/2015   Overview:  Brief, converted to [facility] while in [facility] ED, CBC and CMP were normal, CHADS2 vasc score= 3, was discharged on a beta blocker and asprin  . Palpitations 08/05/2017  . Pneumonia    in 2006  . Primary localized osteoarthritis of left knee 12/09/2016  . S/P knee replacement 12/09/2016  . Severe obesity (BMI >= 40) (HCC) 12/09/2016    Past Surgical History:  Procedure Laterality Date  . ABDOMINAL HYSTERECTOMY     1998  . APPENDECTOMY    .  CARPAL TUNNEL RELEASE Right 2005  . DILATION AND CURETTAGE OF UTERUS  11/26/2016   [id number]  . TOTAL KNEE ARTHROPLASTY Left 12/09/2016   Procedure: TOTAL KNEE ARTHROPLASTY;  Surgeon: Teryl Lucy, MD;  Location: [facility] OR;  Service: Orthopedics;  Laterality: Left;    Current Medications: Current Meds  Medication Sig  . acetaminophen (TYLENOL) 500 MG tablet Take 1,000 mg by mouth daily.  . baclofen (LIORESAL) 10 MG tablet Take 1 tablet (10 mg total) by mouth 3 (three) times daily. As needed for muscle  spasm  . benazepril-hydrochlorthiazide (LOTENSIN HCT) 20-12.5 MG tablet Take 1 tablet by mouth daily.  . cetirizine (ZYRTEC) 10 MG tablet Take 10 mg by mouth daily.  . cholecalciferol (VITAMIN D) 1000 units tablet Take 1,000 Units by mouth daily. 25 mcg  . ELIQUIS 5 MG TABS tablet Take 1 tablet (5 mg total) by mouth 2 (two) times daily. Patient needs appointment for future refills.  Marland Kitchen EPINEPHrine (EPIPEN 2-PAK) 0.3 mg/0.3 mL IJ SOAJ injection Inject 0.3 mg into the muscle daily as needed (for anaphylactic allergic reactions).  . Omega-3 Fatty Acids (FISH OIL) 1000 MG CAPS Take 1,000 mg by mouth daily.  Marland Kitchen oxyCODONE (ROXICODONE) 5 MG immediate release tablet Take 1 tablet (5 mg total) by mouth every 4 (four) hours as needed for severe pain.  . rosuvastatin (CRESTOR) 20 MG tablet Take 20 mg by mouth every evening.  . sertraline (ZOLOFT) 50 MG tablet Take 50 mg by mouth daily.  . traMADol (ULTRAM) 50 MG tablet Take 50 mg by mouth every 6 (six) hours as needed (for pain.).  Marland Kitchen vitamin E 400 UNIT capsule Take 400 Units by mouth daily.     Allergies:   Onion   Social History   Socioeconomic History  . Marital status: Widowed    Spouse name: Not on file  . Number of children: Not on file  . Years of education: Not on file  . Highest education level: Not on file  Occupational History  . Not on file  Social Needs  . Financial resource strain: Not on file  . Food insecurity:    Worry: Not on file    Inability: Not on file  . Transportation needs:    Medical: Not on file    Non-medical: Not on file  Tobacco Use  . Smoking status: Never Smoker  . Smokeless tobacco: Never Used  Substance and Sexual Activity  . Alcohol use: No  . Drug use: No  . Sexual activity: Not on file  Lifestyle  . Physical activity:    Days per week: Not on file    Minutes per session: Not on file  . Stress: Not on file  Relationships  . Social connections:    Talks on phone: Not on file    Gets together: Not  on file    Attends religious service: Not on file    Active member of club or organization: Not on file    Attends meetings of clubs or organizations: Not on file    Relationship status: Not on file  Other Topics Concern  . Not on file  Social History Narrative  . Not on file     Family History: The patient's family history includes Dementia in her mother; Heart disease in her father. ROS:   Please see the history of present illness.    All other systems reviewed and are negative.  EKGs/Labs/Other Studies Reviewed:    The following studies were reviewed today:  EKG:  EKG ordered today.  The ekg ordered today demonstrates sinus rhythm  Recent Labs: 12/10/2016: BUN 13; Creatinine, Ser 0.85; Potassium 4.4; Sodium 136 12/11/2016: Hemoglobin 9.5; Platelets 207  Recent Lipid Panel No results found for: CHOL, TRIG, HDL, CHOLHDL, VLDL, LDLCALC, LDLDIRECT  Physical Exam:    VS:  BP 132/72 (BP Location: Right Arm, Patient Position: Sitting, Cuff Size: Large)   Pulse (!) 55   Ht 5\' 5"  (1.651 m)   Wt 288 lb 6.4 oz (130.8 kg)   SpO2 98%   BMI 47.99 kg/m     Wt Readings from Last 3 Encounters:  08/12/17 288 lb 6.4 oz (130.8 kg)  11/27/16 289 lb 3.2 oz (131.2 kg)     GEN:  Well nourished, well developed in no acute distress HEENT: Normal NECK: No JVD; No carotid bruits LYMPHATICS: No lymphadenopathy CARDIAC: RRR, no murmurs, rubs, gallops RESPIRATORY:  Clear to auscultation without rales, wheezing or rhonchi  ABDOMEN: Soft, non-tender, non-distended MUSCULOSKELETAL:  No edema; No deformity  SKIN: Warm and dry NEUROLOGIC:  Alert and oriented x 3 PSYCHIATRIC:  Normal affect    Signed, Norman HerrlichBrian Takyia Sindt, MD  08/12/2017 12:42 PM    [facility]

## 2017-08-12 ENCOUNTER — Encounter: Payer: Self-pay | Admitting: Cardiology

## 2017-08-12 ENCOUNTER — Ambulatory Visit (INDEPENDENT_AMBULATORY_CARE_PROVIDER_SITE_OTHER): Payer: PPO | Admitting: Cardiology

## 2017-08-12 VITALS — BP 132/72 | HR 55 | Ht 65.0 in | Wt 288.4 lb

## 2017-08-12 DIAGNOSIS — Z7901 Long term (current) use of anticoagulants: Secondary | ICD-10-CM | POA: Diagnosis not present

## 2017-08-12 DIAGNOSIS — I480 Paroxysmal atrial fibrillation: Secondary | ICD-10-CM

## 2017-08-12 DIAGNOSIS — I119 Hypertensive heart disease without heart failure: Secondary | ICD-10-CM | POA: Diagnosis not present

## 2017-08-12 MED ORDER — DILTIAZEM HCL ER COATED BEADS 120 MG PO CP24: 120.0000 mg | ORAL_CAPSULE | ORAL | 3 refills | Status: DC | PRN

## 2017-08-12 NOTE — Patient Instructions (Addendum)
Medication Instructions:  Your physician has recommended you make the following change in your medication:  TAKE cardizem as needed for heart rate greater than 120.  Labwork: None  Testing/Procedures: You had an EKG today.  Follow-Up: Your physician wants you to follow-up in: 6 months. You will receive a reminder letter in the mail two months in advance. If you don't receive a letter, please call our office to schedule the follow-up appointment.  Any Other Special Instructions Will Be Listed Below (If Applicable).     If you need a refill on your cardiac medications before your next appointment, please call your pharmacy.    1. Avoid all over-the-counter antihistamines except Claritin/Loratadine and Zyrtec/Cetrizine. 2. Avoid all combination including cold sinus allergies flu decongestant and sleep medications 3. You can use Robitussin DM Mucinex and Mucinex DM for cough. 4. can use Tylenol aspirin ibuprofen and naproxen but no combinations such as sleep or sinus.

## 2017-08-20 ENCOUNTER — Other Ambulatory Visit: Payer: Self-pay

## 2017-08-20 NOTE — Patient Outreach (Addendum)
[facility]) Care Management  08/20/2017  Mikaelyn Arthurs [facility] 02/21/1951 [id number]  TELEPHONE SCREENING Referral date: 08/20/17 Referral source:  [facility] utilization management Referral reason: medication assistance Insurance: health team advantage  Telephone call to patient regarding utilization management.  HIPAA verified with patient. Explained reason for call.  Patient states her copayment for Eliquis is $50 per month.  Patient states she is able to afford her medication at this time. Patient states when she gets in the "donut hole" the medication is approximately $300 per month. States she goes into the donut hole with her medications in September or October each year.  Patient states she would like see if she is able to get some assistance regarding her medication.  RNCM discussed and offered [facility] care management services. Patient verbally agreed to pharmacy services.   Patient denied need for nursing follow up at this time.  RNCM provided contact name and number: [phone] or main office number [phone] and 24 hour nurse advise line [phone].   PLAN: RNCM will refer patient to [facility] pharmacy for medication assistance.   George Ina RN,BSN,CCM [facility] Telephonic  [phone]

## 2017-08-21 ENCOUNTER — Ambulatory Visit: Payer: Self-pay

## 2017-08-21 ENCOUNTER — Other Ambulatory Visit: Payer: Self-pay | Admitting: Pharmacist

## 2017-08-21 NOTE — Patient Outreach (Signed)
[facility]) Care Management  [facility]   08/21/2017  Gina Coffey [facility] Apr 15, 1951 [id number]  Subjective: A referral was received from telephonic health coach for medication assistance. HIPAA identifiers were obtained. Patient is a 67 year old female with multiple medical conditions including but not limited to:  Anticoagulation, hypertension, atrial fibrillation, status post knee replacement.  Patient reported managing her medications on her own.  She said her Eliquis copay is close to $50 per month and last year she went in the donut hole close in September.  Objective:   Encounter Medications: Outpatient Encounter Medications as of 08/21/2017  Medication Sig Note  . acetaminophen (TYLENOL) 500 MG tablet Take 1,000 mg by mouth daily.   . benazepril-hydrochlorthiazide (LOTENSIN HCT) 20-12.5 MG tablet Take 1 tablet by mouth daily.   . cetirizine (ZYRTEC) 10 MG tablet Take 10 mg by mouth daily.   . cholecalciferol (VITAMIN D) 1000 units tablet Take 1,000 Units by mouth daily. 25 mcg   . diltiazem (CARDIZEM CD) 120 MG 24 hr capsule Take 1 capsule (120 mg total) by mouth as needed. For heart rate greater than 120.   Marland Kitchen ELIQUIS 5 MG TABS tablet Take 1 tablet (5 mg total) by mouth 2 (two) times daily. Patient needs appointment for future refills.   Marland Kitchen EPINEPHrine (EPIPEN 2-PAK) 0.3 mg/0.3 mL IJ SOAJ injection Inject 0.3 mg into the muscle daily as needed (for anaphylactic allergic reactions).   . Omega-3 Fatty Acids (FISH OIL) 1000 MG CAPS Take 1,000 mg by mouth daily.   . rosuvastatin (CRESTOR) 20 MG tablet Take 20 mg by mouth every evening.   . sertraline (ZOLOFT) 50 MG tablet Take 50 mg by mouth daily.   . traMADol (ULTRAM) 50 MG tablet Take 50 mg by mouth every 6 (six) hours as needed (for pain.). 08/21/2017: PRN  . vitamin E 400 UNIT capsule Take 400 Units by mouth daily.   . [DISCONTINUED] baclofen (LIORESAL) 10 MG tablet Take 1 tablet (10 mg total) by mouth 3  (three) times daily. As needed for muscle spasm (Patient not taking: Reported on 08/21/2017)   . [DISCONTINUED] oxyCODONE (ROXICODONE) 5 MG immediate release tablet Take 1 tablet (5 mg total) by mouth every 4 (four) hours as needed for severe pain. (Patient not taking: Reported on 08/21/2017)    No facility-administered encounter medications on file as of 08/21/2017.     Functional Status: In your present state of health, do you have any difficulty performing the following activities: 11/27/2016  Hearing? N  Vision? N  Difficulty concentrating or making decisions? N  Walking or climbing stairs? Y  Comment related to left knee pain  Dressing or bathing? N  Some recent data might be hidden    Fall/Depression Screening: No flowsheet data found. PHQ 2/9 Scores 08/21/2017  PHQ - 2 Score 0      Assessment: Patient's medications were reviewed over the phone.    Drugs sorted by system:  Neurologic/Psychologic: Sertraline  Cardiovascular: Benazepril/HCTZ Diltiazem CD Eliquis Omega 3 Fatty Acids Rosuvastatin  Pulmonary/Allergy: Cetirizine Epi-Pen  Pain: Acetaminophen Tramadol  Vitamins/Minerals: Cholecalciferol Vitamin E  Medication Assistance Findings: -patient has Health[employer] Advantage -she is over income for the Extra Help Program or LIS -According to HTA, patient's TROOP is $220.91 -patient MAY qualify for [employer]'s program to obtain Eliquis -[employer] requires patients to spend 3% of their household income on out of pocket medication expenses. -patient was encouraged to keep all EOB's from [employer]TA and keep  up with her out-of-pocket medication expenses.      Plan: -close patient's case -patient will follow up with [facility] Pharmacist when her expenses get close to $900 -patient communicated understanding and wrote down my contact information.   Beecher Mcardle, PharmD, BCACP [facility] Clinical Pharmacist [phone]

## 2017-09-02 DIAGNOSIS — M1711 Unilateral primary osteoarthritis, right knee: Secondary | ICD-10-CM | POA: Diagnosis not present

## 2017-09-16 DIAGNOSIS — Z79899 Other long term (current) drug therapy: Secondary | ICD-10-CM | POA: Diagnosis not present

## 2017-09-16 DIAGNOSIS — E785 Hyperlipidemia, unspecified: Secondary | ICD-10-CM | POA: Diagnosis not present

## 2017-09-16 DIAGNOSIS — I10 Essential (primary) hypertension: Secondary | ICD-10-CM | POA: Diagnosis not present

## 2017-09-16 DIAGNOSIS — I480 Paroxysmal atrial fibrillation: Secondary | ICD-10-CM | POA: Diagnosis not present

## 2018-01-09 DIAGNOSIS — H6123 Impacted cerumen, bilateral: Secondary | ICD-10-CM | POA: Diagnosis not present

## 2018-01-09 DIAGNOSIS — J069 Acute upper respiratory infection, unspecified: Secondary | ICD-10-CM | POA: Diagnosis not present

## 2018-01-09 DIAGNOSIS — R05 Cough: Secondary | ICD-10-CM | POA: Diagnosis not present

## 2018-02-01 ENCOUNTER — Telehealth: Payer: Self-pay | Admitting: *Deleted

## 2018-02-01 NOTE — Telephone Encounter (Signed)
Left message for patient to return call to schedule an appointment before cardiac clearance for knee replacement surgery can be given by Dr. Dulce Sellar.

## 2018-02-10 DIAGNOSIS — R000 Tachycardia, unspecified: Secondary | ICD-10-CM | POA: Diagnosis not present

## 2018-02-10 DIAGNOSIS — E785 Hyperlipidemia, unspecified: Secondary | ICD-10-CM | POA: Diagnosis not present

## 2018-02-10 DIAGNOSIS — E782 Mixed hyperlipidemia: Secondary | ICD-10-CM | POA: Diagnosis not present

## 2018-02-10 DIAGNOSIS — Z743 Need for continuous supervision: Secondary | ICD-10-CM | POA: Diagnosis not present

## 2018-02-10 DIAGNOSIS — I4891 Unspecified atrial fibrillation: Secondary | ICD-10-CM | POA: Diagnosis not present

## 2018-02-10 DIAGNOSIS — I499 Cardiac arrhythmia, unspecified: Secondary | ICD-10-CM | POA: Diagnosis not present

## 2018-02-10 DIAGNOSIS — Z6841 Body Mass Index (BMI) 40.0 and over, adult: Secondary | ICD-10-CM | POA: Diagnosis not present

## 2018-02-10 DIAGNOSIS — I10 Essential (primary) hypertension: Secondary | ICD-10-CM | POA: Diagnosis not present

## 2018-02-10 DIAGNOSIS — R079 Chest pain, unspecified: Secondary | ICD-10-CM | POA: Diagnosis not present

## 2018-02-10 DIAGNOSIS — R0789 Other chest pain: Secondary | ICD-10-CM | POA: Diagnosis not present

## 2018-02-10 DIAGNOSIS — Z7901 Long term (current) use of anticoagulants: Secondary | ICD-10-CM | POA: Diagnosis not present

## 2018-02-10 DIAGNOSIS — Z79899 Other long term (current) drug therapy: Secondary | ICD-10-CM | POA: Diagnosis not present

## 2018-02-10 DIAGNOSIS — I361 Nonrheumatic tricuspid (valve) insufficiency: Secondary | ICD-10-CM

## 2018-02-11 DIAGNOSIS — I10 Essential (primary) hypertension: Secondary | ICD-10-CM | POA: Diagnosis not present

## 2018-02-11 DIAGNOSIS — R079 Chest pain, unspecified: Secondary | ICD-10-CM | POA: Diagnosis not present

## 2018-02-11 DIAGNOSIS — E782 Mixed hyperlipidemia: Secondary | ICD-10-CM | POA: Diagnosis not present

## 2018-02-11 DIAGNOSIS — I4891 Unspecified atrial fibrillation: Secondary | ICD-10-CM | POA: Diagnosis not present

## 2018-02-12 ENCOUNTER — Telehealth: Payer: Self-pay | Admitting: Cardiology

## 2018-02-12 NOTE — Telephone Encounter (Signed)
I would reduce her metoprolol by 50% see me next week at [facility]

## 2018-02-12 NOTE — Telephone Encounter (Signed)
Left message per DPR advising patient to cut her metoprolol in half and take 12.5 mg twice daily per Dr. Dulce Sellar. Informed her to contact our office on Monday to schedule a follow up appointment.

## 2018-02-12 NOTE — Telephone Encounter (Signed)
Her pulse is down to 56-she did not take her metoprolol

## 2018-02-12 NOTE — Telephone Encounter (Signed)
Left message for patient to return call.

## 2018-02-12 NOTE — Telephone Encounter (Addendum)
Patient was recently seen at [facility] for atrial fibrillation. She was started on metoprolol 25 mg twice daily and digoxin 125 mcg once daily. Patient reports her heart rate today went down to 53 after taking metoprolol and took hours for it to come back above 60. No blood pressure record at this time from the patient. She feels okay, no dizziness. She wants Dr. Hulen Shouts opinion on this and if she should continue Metoprolol.

## 2018-02-16 ENCOUNTER — Other Ambulatory Visit: Payer: Self-pay | Admitting: *Deleted

## 2018-02-16 DIAGNOSIS — I480 Paroxysmal atrial fibrillation: Secondary | ICD-10-CM | POA: Diagnosis not present

## 2018-02-16 DIAGNOSIS — Z6841 Body Mass Index (BMI) 40.0 and over, adult: Secondary | ICD-10-CM | POA: Diagnosis not present

## 2018-02-16 DIAGNOSIS — R002 Palpitations: Secondary | ICD-10-CM | POA: Diagnosis not present

## 2018-02-16 NOTE — Patient Outreach (Signed)
[facility]) Care Management  02/16/2018  Gina Coffey Gina Coffey Feb 13, 1951 [id number]   Transition of Care Referral   Referral Date: 02/15/18 Referral Source: [facility] Urgent Outreach for Gina Coffey Date of Admission: 02/10/18 Diagnosis: Unspecified atrial fibrillation Date of Discharge: on 02/11/18 Facility: [facility]  Insurance: [facility]  Outreach attempt #1 successful  Patient is able to verify HIPAA Reviewed and addressed Transitional of care referral with patient She agreed to the call and assessment only if it "would not take too long" She confirms she got her discharge instructions from [facility] and denied concerns with them She reports following up with the MD PA today and she is scheduled to see the cardiologist on Friday 02/19/18 She denies concerns medically, transportation to medical appointments, advance directives, denies inquired Afib s/s, medication issues, and need for services from [facility] for pharmacy, care coordination, SW, [facility] coach, etc. Cm provided CM's contact number and the [facility]TA 24 hour RN line number to use prn She voiced appreciation for the call   Transition of care assessment completed  Conditions: PAF, hypertensive heart disease, primary localized osteoarthritis of left knee, severe obesity, s/p knee replacement, hypercholesteremia, chronic anticoagulation, palpitations, allergies  Medications: does not obtain the flu shot  denies concerns with taking medications as prescribed, affording medications, side effects of medications and questions about medications   Advance directives: Denies need for assist with advance directives   Consent: [facility] RN CM reviewed [facility] services with patient. Patient gave verbal consent for services. Advised patient that other post discharge calls may occur to assess how the patient is doing following the recent hospitalization. Patient voiced understanding and was appreciative of f/u call.  Plan: [facility] RN CM  will close case at this time as patient has been assessed and no needs identified.   [facility] RN CM sent a successful outreach letter as discussed with [facility] brochure enclosed for review  Pt encouraged to return a call to [facility] RN CM prn   Alesia Oshields L. Noelle Penner, RN, BSN, CCM [facility] Telephonic Care Management Care Coordinator Direct Number [phone] Mobile number [phone]  Main [facility] number [phone] Fax number [phone]

## 2018-02-18 NOTE — Progress Notes (Signed)
Cardiology Office Note:    Date:  02/19/2018   ID:  [name], DOB Mar 30, 1951, MRN [id number]  PCP:  Philemon Kingdom, MD  Cardiologist:  Norman Herrlich, MD    Referring MD: Philemon Kingdom, MD    ASSESSMENT:    1. PAF (paroxysmal atrial fibrillation) (HCC)   2. Chronic anticoagulation   3. Hypertensive heart disease without heart failure    PLAN:    In order of problems listed above:  1. She is having recurrent atrial fibrillation reviewed the option referral for EP ablation or antiarrhythmic drug she does not have coronary disease has normal left ventricular function and cardiac structure and will start low-dose flecainide and with her bradycardia bradycardia will take the rate limiting calcium channel blocker only for rapid heart rhythm recurrence.  I told her not to take the digoxin and maintenance beta-blocker she was prescribed at the hospital and continue her current anticoagulant. 2. Continue her anticoagulant 3. Stable blood pressure target continue ACE thiazide diuretic 4. Dyslipidemia stable continue statin   Next appointment: 6 weeks   Medication Adjustments/Labs and Tests Ordered: Current medicines are reviewed at length with the patient today.  Concerns regarding medicines are outlined above.  No orders of the defined types were placed in this encounter.  Meds ordered this encounter  Medications  . flecainide (TAMBOCOR) 50 MG tablet    Sig: Take 1 tablet (50 mg total) by mouth 2 (two) times daily.    Dispense:  60 tablet    Refill:  3    Chief Complaint  Patient presents with  . Follow-up    History of Present Illness:    Gina Coffey is a 67 y.o. female with a hx of paroxysmal atrial fibrillation, anticoagulation and hypertension   last seen by me 08/12/17.  She has symptomatic recurrent atrial fibrillation is admitted to [facility] 02/10/2018.  Echocardiogram was performed normal left ventricular function right  ventricle is normal atria are normal in size unremarkable testing at the time of the echocardiogram she has resumed sinus rhythm.  Independently reviewed the EKG shows atrial fibrillation the emergency room with a rapid ventricular response she was discharged the next day.  In sinus rhythm laboratory studies included a normal CBC normal BMP cholesterol 151 HDL 38 and discharged on the hospital she was placed on low-dose beta-blocker.  She underwent an ischemia evaluation during that admission a myocardial perfusion study done pharmacologically showed an ejection fraction 84% with normal myocardial perfusion.  TSH was normal free T3 was normal.  LDL was 85.  Cardiac troponin was normal throughout the stay on repeat testing and chest x-ray was unremarkable  Compliance with diet, lifestyle and medications: Yes   Is a third episode in 3 years second in 6 months and after discussion start antiarrhythmic drug with low-dose flecainide follow-up EKG next week and she will take the calcium channel blocker as needed for rapid rates for recurrence or remain anticoagulated.  Although she is in sinus rhythm she was prescribed both digoxin and maintenance beta-blocker at [facility] she never stopped because she tends to have resting bradycardia.  She told me she presented to the hospital she did not take the calcium channel blocker in all honesty did not feel badly and despite the records speaking of chest pain she tells me that she did not experience it.  She is under intense stress at home with the recent death of her son-in-law   Past Medical History:  Diagnosis Date  .  Anemia   . Anxiety   . Arthritis   . Depression   . Dysrhythmia    atrial fibrillation  . Hypercholesteremia 08/05/2017  . Hypertension   . PAF (paroxysmal atrial fibrillation) (HCC) 04/29/2015   Overview:  Brief, converted to [facility] while in [facility] ED, CBC and CMP were normal, CHADS2 vasc score= 3, was discharged on a beta blocker and asprin    . Palpitations 08/05/2017  . Pneumonia    in 2006  . Primary localized osteoarthritis of left knee 12/09/2016  . S/P knee replacement 12/09/2016  . Severe obesity (BMI >= 40) (HCC) 12/09/2016    Past Surgical History:  Procedure Laterality Date  . ABDOMINAL HYSTERECTOMY     1998  . APPENDECTOMY    . CARPAL TUNNEL RELEASE Right 2005  . DILATION AND CURETTAGE OF UTERUS  11/26/2016   [id number]  . TOTAL KNEE ARTHROPLASTY Left 12/09/2016   Procedure: TOTAL KNEE ARTHROPLASTY;  Surgeon: Teryl Lucy, MD;  Location: [facility] OR;  Service: Orthopedics;  Laterality: Left;    Current Medications: Current Meds  Medication Sig  . benazepril-hydrochlorthiazide (LOTENSIN HCT) 20-12.5 MG tablet Take 1 tablet by mouth daily.  . cetirizine (ZYRTEC) 10 MG tablet Take 10 mg by mouth daily.  . cholecalciferol (VITAMIN D) 1000 units tablet Take 1,000 Units by mouth daily. 25 mcg  . diltiazem (CARDIZEM CD) 120 MG 24 hr capsule Take 1 capsule (120 mg total) by mouth as needed. For heart rate greater than 120.  Marland Kitchen ELIQUIS 5 MG TABS tablet Take 1 tablet (5 mg total) by mouth 2 (two) times daily. Patient needs appointment for future refills.  Marland Kitchen EPINEPHrine (EPIPEN 2-PAK) 0.3 mg/0.3 mL IJ SOAJ injection Inject 0.3 mg into the muscle daily as needed (for anaphylactic allergic reactions).  . Omega-3 Fatty Acids (FISH OIL) 1000 MG CAPS Take 1,000 mg by mouth daily.  . rosuvastatin (CRESTOR) 20 MG tablet Take 20 mg by mouth every evening.  . sertraline (ZOLOFT) 50 MG tablet Take 50 mg by mouth daily.  . traMADol (ULTRAM) 50 MG tablet Take 50 mg by mouth every 6 (six) hours as needed (for pain.).  Marland Kitchen vitamin E 400 UNIT capsule Take 400 Units by mouth daily.     Allergies:   Onion   Social History   Socioeconomic History  . Marital status: Widowed    Spouse name: Not on file  . Number of children: Not on file  . Years of education: Not on file  . Highest education level: Not on file  Occupational History  . Not on  file  Social Needs  . Financial resource strain: Not on file  . Food insecurity:    Worry: Not on file    Inability: Not on file  . Transportation needs:    Medical: Not on file    Non-medical: Not on file  Tobacco Use  . Smoking status: Never Smoker  . Smokeless tobacco: Never Used  Substance and Sexual Activity  . Alcohol use: No  . Drug use: No  . Sexual activity: Not on file  Lifestyle  . Physical activity:    Days per week: Not on file    Minutes per session: Not on file  . Stress: Not on file  Relationships  . Social connections:    Talks on phone: Not on file    Gets together: Not on file    Attends religious service: Not on file    Active member of club or organization: Not on file  Attends meetings of clubs or organizations: Not on file    Relationship status: Not on file  Other Topics Concern  . Not on file  Social History Narrative  . Not on file     Family History: The patient's family history includes Dementia in her mother; Heart disease in her father. ROS:   Please see the history of present illness.    All other systems reviewed and are negative.  EKGs/Labs/Other Studies Reviewed:    The following studies were reviewed today:  EKG:  EKG ordered today.  The ekg ordered today demonstrates SRTH normal  Recent Labs: No results found for requested labs within last 8760 hours.  Recent Lipid Panel No results found for: CHOL, TRIG, HDL, CHOLHDL, VLDL, LDLCALC, LDLDIRECT  Physical Exam:    VS:  BP 114/70 (BP Location: Right Arm, Patient Position: Sitting, Cuff Size: Large)   Pulse (!) 59   Ht 5\' 5"  (1.651 m)   Wt 291 lb 1.9 oz (132.1 kg)   SpO2 97%   BMI 48.44 kg/m     Wt Readings from Last 3 Encounters:  02/19/18 291 lb 1.9 oz (132.1 kg)  08/12/17 288 lb 6.4 oz (130.8 kg)  11/27/16 289 lb 3.2 oz (131.2 kg)     GEN:  Well nourished, well developed in no acute distress HEENT: Normal NECK: No JVD; No carotid bruits LYMPHATICS: No  lymphadenopathy CARDIAC: RRR, no murmurs, rubs, gallops RESPIRATORY:  Clear to auscultation without rales, wheezing or rhonchi  ABDOMEN: Soft, non-tender, non-distended MUSCULOSKELETAL:  No edema; No deformity  SKIN: Warm and dry NEUROLOGIC:  Alert and oriented x 3 PSYCHIATRIC:  Normal affect    Signed, Norman Herrlich, MD  02/19/2018 10:58 AM    [facility]

## 2018-02-19 ENCOUNTER — Encounter: Payer: Self-pay | Admitting: Cardiology

## 2018-02-19 ENCOUNTER — Ambulatory Visit (INDEPENDENT_AMBULATORY_CARE_PROVIDER_SITE_OTHER): Payer: PPO | Admitting: Cardiology

## 2018-02-19 VITALS — BP 114/70 | HR 59 | Ht 65.0 in | Wt 291.1 lb

## 2018-02-19 DIAGNOSIS — I119 Hypertensive heart disease without heart failure: Secondary | ICD-10-CM

## 2018-02-19 DIAGNOSIS — Z7901 Long term (current) use of anticoagulants: Secondary | ICD-10-CM

## 2018-02-19 DIAGNOSIS — I480 Paroxysmal atrial fibrillation: Secondary | ICD-10-CM | POA: Diagnosis not present

## 2018-02-19 MED ORDER — FLECAINIDE ACETATE 50 MG PO TABS: 50.0000 mg | ORAL_TABLET | Freq: Two times a day (BID) | ORAL | 3 refills | Status: DC

## 2018-02-19 NOTE — Patient Instructions (Addendum)
Medication Instructions:  Your physician has recommended you make the following change in your medication:   START flecainide (tambocor) 50 mg: Take 1 tablet twice daily   If you need a refill on your cardiac medications before your next appointment, please call your pharmacy.   Lab work: None  If you have labs (blood work) drawn today and your tests are completely normal, you will receive your results only by: Marland Kitchen MyChart Message (if you have MyChart) OR . A paper copy in the mail If you have any lab test that is abnormal or we need to change your treatment, we will call you to review the results.  Testing/Procedures: None  Follow-Up: At [facility], you and your health needs are our priority.  As part of our continuing mission to provide you with exceptional heart care, we have created designated Provider Care Teams.  These Care Teams include your primary Cardiologist (physician) and Advanced Practice Providers (APPs -  Physician Assistants and Nurse Practitioners) who all work together to provide you with the care you need, when you need it. . You will need a follow up appointment in 6 weeks.    Any Other Special Instructions Will Be Listed Below (If Applicable).  **You will be scheduled for a nurse visit for an EKG on Tuesday, 02/23/18 in the [place] office.     Flecainide tablets What is this medicine? FLECAINIDE (FLEK a nide) is an antiarrhythmic drug. This medicine is used to prevent irregular heart rhythm. It can also slow down fast heartbeats called tachycardia. This medicine may be used for other purposes; ask your health care provider or pharmacist if you have questions. COMMON BRAND NAME(S): Tambocor What should I tell my health care provider before I take this medicine? They need to know if you have any of these conditions: -abnormal levels of potassium in the blood -heart disease including heart rhythm and heart rate problems -kidney or liver disease -recent heart  attack -an unusual or allergic reaction to flecainide, local anesthetics, other medicines, foods, dyes, or preservatives -pregnant or trying to get pregnant -breast-feeding How should I use this medicine? Take this medicine by mouth with a glass of water. Follow the directions on the prescription label. You can take this medicine with or without food. Take your doses at regular intervals. Do not take your medicine more often than directed. Do not stop taking this medicine suddenly. This may cause serious, heart-related side effects. If your doctor wants you to stop the medicine, the dose may be slowly lowered over time to avoid any side effects. Talk to your pediatrician regarding the use of this medicine in children. While this drug may be prescribed for children as young as 1 year of age for selected conditions, precautions do apply. Overdosage: If you think you have taken too much of this medicine contact a poison control center or emergency room at once. NOTE: This medicine is only for you. Do not share this medicine with others. What if I miss a dose? If you miss a dose, take it as soon as you can. If it is almost time for your next dose, take only that dose. Do not take double or extra doses. What may interact with this medicine? Do not take this medicine with any of the following medications: -amoxapine -arsenic trioxide -certain antibiotics like clarithromycin, erythromycin, gatifloxacin, gemifloxacin, levofloxacin, moxifloxacin, sparfloxacin, or troleandomycin -certain antidepressants called tricyclic antidepressants like amitriptyline, imipramine, or nortriptyline -certain medicines to control heart rhythm like disopyramide, dofetilide, encainide,  moricizine, procainamide, propafenone, and quinidine -cisapride -cyclobenzaprine -delavirdine -droperidol -haloperidol -hawthorn -imatinib -levomethadyl -maprotiline -medicines for malaria like chloroquine and  halofantrine -pentamidine -phenothiazines like chlorpromazine, mesoridazine, prochlorperazine, thioridazine -pimozide -quinine -ranolazine -ritonavir -sertindole -ziprasidone This medicine may also interact with the following medications: -cimetidine -medicines for angina or high blood pressure -medicines to control heart rhythm like amiodarone and digoxin This list may not describe all possible interactions. Give your health care provider a list of all the medicines, herbs, non-prescription drugs, or dietary supplements you use. Also tell them if you smoke, drink alcohol, or use illegal drugs. Some items may interact with your medicine. What should I watch for while using this medicine? Visit your doctor or health care professional for regular checks on your progress. Because your condition and the use of this medicine carries some risk, it is a good idea to carry an identification card, necklace or bracelet with details of your condition, medications and doctor or health care professional. Check your blood pressure and pulse rate regularly. Ask your health care professional what your blood pressure and pulse rate should be, and when you should contact him or her. Your doctor or health care professional also may schedule regular blood tests and electrocardiograms to check your progress. You may get drowsy or dizzy. Do not drive, use machinery, or do anything that needs mental alertness until you know how this medicine affects you. Do not stand or sit up quickly, especially if you are an older patient. This reduces the risk of dizzy or fainting spells. Alcohol can make you more dizzy, increase flushing and rapid heartbeats. Avoid alcoholic drinks. What side effects may I notice from receiving this medicine? Side effects that you should report to your doctor or health care professional as soon as possible: -chest pain, continued irregular heartbeats -difficulty breathing -swelling of the legs or  feet -trembling, shaking -unusually weak or tired Side effects that usually do not require medical attention (report to your doctor or health care professional if they continue or are bothersome): -blurred vision -constipation -headache -nausea, vomiting -stomach pain This list may not describe all possible side effects. Call your doctor for medical advice about side effects. You may report side effects to FDA at 1-800-FDA-1088. Where should I keep my medicine? Keep out of the reach of children. Store at room temperature between 15 and 30 degrees C (59 and 86 degrees F). Protect from light. Keep container tightly closed. Throw away any unused medicine after the expiration date. NOTE: This sheet is a summary. It may not cover all possible information. If you have questions about this medicine, talk to your doctor, pharmacist, or health care provider.  2018 Elsevier/Gold Standard (2007-08-11 16:46:09)

## 2018-02-23 ENCOUNTER — Ambulatory Visit (INDEPENDENT_AMBULATORY_CARE_PROVIDER_SITE_OTHER): Payer: PPO | Admitting: Cardiology

## 2018-02-23 DIAGNOSIS — I480 Paroxysmal atrial fibrillation: Secondary | ICD-10-CM

## 2018-02-23 NOTE — Patient Instructions (Addendum)
Medication Instructions:  Your physician recommends that you continue on your current medications as directed. Please refer to the Current Medication list given to you today.  If you need a refill on your cardiac medications before your next appointment, please call your pharmacy.   Lab work: None  If you have labs (blood work) drawn today and your tests are completely normal, you will receive your results only by: Marland Kitchen MyChart Message (if you have MyChart) OR . A paper copy in the mail If you have any lab test that is abnormal or we need to change your treatment, we will call you to review the results.  Testing/Procedures: You had an EKG today.   Follow-Up: At [facility], you and your health needs are our priority.  As part of our continuing mission to provide you with exceptional heart care, we have created designated Provider Care Teams.  These Care Teams include your primary Cardiologist (physician) and Advanced Practice Providers (APPs -  Physician Assistants and Nurse Practitioners) who all work together to provide you with the care you need, when you need it. . Your physician recommends that you keep your follow-up appointment as scheduled with Dr. Dulce Sellar on 04/06/18 at 8 am.

## 2018-02-23 NOTE — Progress Notes (Signed)
Patient came in for an EKG after starting flecainide 50 mg twice daily on Saturday, 02/20/18. Patient has no complaints and reports feeling good. EKG done and reviewed by Dr. Dulce Sellar who advised patient should continue current medications as prescribed. Patient will follow up with Dr. Dulce Sellar as scheduled on 04/06/18. Patient informed that she is cleared from a cardiac standpoint for her right knee replacement surgery, clearance form has been faxed to [employer]. She can go on her 11 day cruise at the beginning of December per [facility]. Patient verbalized understanding. No further questions.

## 2018-03-02 DIAGNOSIS — J069 Acute upper respiratory infection, unspecified: Secondary | ICD-10-CM | POA: Diagnosis not present

## 2018-03-15 DIAGNOSIS — J019 Acute sinusitis, unspecified: Secondary | ICD-10-CM | POA: Diagnosis not present

## 2018-03-22 DIAGNOSIS — Z1339 Encounter for screening examination for other mental health and behavioral disorders: Secondary | ICD-10-CM | POA: Diagnosis not present

## 2018-03-22 DIAGNOSIS — Z1331 Encounter for screening for depression: Secondary | ICD-10-CM | POA: Diagnosis not present

## 2018-03-22 DIAGNOSIS — Z0000 Encounter for general adult medical examination without abnormal findings: Secondary | ICD-10-CM | POA: Diagnosis not present

## 2018-03-22 DIAGNOSIS — Z6841 Body Mass Index (BMI) 40.0 and over, adult: Secondary | ICD-10-CM | POA: Diagnosis not present

## 2018-04-05 NOTE — Progress Notes (Signed)
Cardiology Office Note:    Date:  04/06/2018   ID:  Gina Coffey, DOB [birth year], MRN [id number]  PCP:  Philemon Kingdom, MD  Cardiologist:  Norman Herrlich, MD    Referring MD: Philemon Kingdom, MD    ASSESSMENT:    1. PAF (paroxysmal atrial fibrillation) (HCC)   2. Chronic anticoagulation   3. Hypertensive heart disease without heart failure   4. High risk medication use    PLAN:    In order of problems listed above:  1. Stable she tolerates her low-dose antiarrhythmic drug without side effects will continue along with calcium channel blocker anticoagulant I stressed the need to avoid over-the-counter proarrhythmic drugs.  In the absence of recurrent atrial fibrillation I will see her in 6 months 2. Stroke risk is moderate continue Eliquis 3. Stable at target continue current treatment 4. Stable no evidence of toxicity her dose is so low I do not think she requires a drug level   Next appointment: 6 months   Medication Adjustments/Labs and Tests Ordered: Current medicines are reviewed at length with the patient today.  Concerns regarding medicines are outlined above.  No orders of the defined types were placed in this encounter.  No orders of the defined types were placed in this encounter.   Chief Complaint  Patient presents with  . Follow-up  . Atrial Fibrillation    started on flecanide    History of Present Illness:    Gina Coffey is a 67 y.o. female with a hx of  paroxysmal atrial fibrillation, anticoagulation and hypertension    last seen 02/23/18 and initiated on flecanide. EKG 02/23/18 SRTH and normal  She has symptomatic recurrent atrial fibrillation is admitted to [facility] 02/10/2018.  Echocardiogram was performed normal left ventricular function right ventricle is normal atria are normal in size unremarkable testing at the time of the echocardiogram she has resumed sinus rhythm.  Independently reviewed the EKG shows  atrial fibrillation the emergency room with a rapid ventricular response she was discharged the next day.  In sinus rhythm laboratory studies included a normal CBC normal BMP cholesterol 151 HDL 38 and discharged on the hospital she was placed on low-dose beta-blocker.  She underwent an ischemia evaluation during that admission a myocardial perfusion study done pharmacologically showed an ejection fraction 84% with normal myocardial perfusion.  TSH was normal free T3 was normal.  LDL was 85.  Cardiac troponin was normal throughout the stay on repeat testing and chest x-ray was unremarkable  Compliance with diet, lifestyle and medications: yes Past Medical History:  Diagnosis Date  . Anemia   . Anxiety   . Arthritis   . Depression   . Dysrhythmia    atrial fibrillation  . Hypercholesteremia 08/05/2017  . Hypertension   . PAF (paroxysmal atrial fibrillation) (HCC) 04/29/2015   Overview:  Brief, converted to [facility] while in [facility] ED, CBC and CMP were normal, CHADS2 vasc score= 3, was discharged on a beta blocker and asprin  . Palpitations 08/05/2017  . Pneumonia    in 2006  . Primary localized osteoarthritis of left knee 12/09/2016  . S/P knee replacement 12/09/2016  . Severe obesity (BMI >= 40) (HCC) 12/09/2016    Past Surgical History:  Procedure Laterality Date  . ABDOMINAL HYSTERECTOMY     1998  . APPENDECTOMY    . CARPAL TUNNEL RELEASE Right 2005  . DILATION AND CURETTAGE OF UTERUS  11/26/2016   [id number]  . TOTAL KNEE ARTHROPLASTY Left 12/09/2016  Procedure: TOTAL KNEE ARTHROPLASTY;  Surgeon: Teryl Lucy, MD;  Location: [facility] OR;  Service: Orthopedics;  Laterality: Left;    Current Medications: Current Meds  Medication Sig  . cetirizine (ZYRTEC) 10 MG tablet Take 10 mg by mouth daily.  . cholecalciferol (VITAMIN D) 1000 units tablet Take 1,000 Units by mouth daily. 25 mcg  . diltiazem (CARDIZEM CD) 120 MG 24 hr capsule Take 1 capsule (120 mg total) by mouth as needed. For heart rate  greater than 120.  Marland Kitchen ELIQUIS 5 MG TABS tablet Take 1 tablet (5 mg total) by mouth 2 (two) times daily. Patient needs appointment for future refills.  Marland Kitchen EPINEPHrine (EPIPEN 2-PAK) 0.3 mg/0.3 mL IJ SOAJ injection Inject 0.3 mg into the muscle daily as needed (for anaphylactic allergic reactions).  . flecainide (TAMBOCOR) 50 MG tablet Take 1 tablet (50 mg total) by mouth 2 (two) times daily.  . Omega-3 Fatty Acids (FISH OIL) 1000 MG CAPS Take 1,000 mg by mouth daily.  . rosuvastatin (CRESTOR) 20 MG tablet Take 20 mg by mouth every evening.  . sertraline (ZOLOFT) 50 MG tablet Take 50 mg by mouth daily.  . traMADol (ULTRAM) 50 MG tablet Take 50 mg by mouth every 6 (six) hours as needed (for pain.).  Marland Kitchen vitamin E 400 UNIT capsule Take 400 Units by mouth daily.     Allergies:   Onion   Social History   Socioeconomic History  . Marital status: Widowed    Spouse name: Not on file  . Number of children: Not on file  . Years of education: Not on file  . Highest education level: Not on file  Occupational History  . Not on file  Social Needs  . Financial resource strain: Not on file  . Food insecurity:    Worry: Not on file    Inability: Not on file  . Transportation needs:    Medical: Not on file    Non-medical: Not on file  Tobacco Use  . Smoking status: Never Smoker  . Smokeless tobacco: Never Used  Substance and Sexual Activity  . Alcohol use: No  . Drug use: No  . Sexual activity: Not on file  Lifestyle  . Physical activity:    Days per week: Not on file    Minutes per session: Not on file  . Stress: Not on file  Relationships  . Social connections:    Talks on phone: Not on file    Gets together: Not on file    Attends religious service: Not on file    Active member of club or organization: Not on file    Attends meetings of clubs or organizations: Not on file    Relationship status: Not on file  Other Topics Concern  . Not on file  Social History Narrative  . Not on  file     Family History: The patient's family history includes Dementia in her mother; Heart disease in her father. ROS:   Please see the history of present illness.    All other systems reviewed and are negative.  EKGs/Labs/Other Studies Reviewed:    The following studies were reviewed today:  EKG: 02/23/2018 and flecainide sinus rhythm normal including QT interval  Recent Labs: No results found for requested labs within last 8760 hours.  Recent Lipid Panel No results found for: CHOL, TRIG, HDL, CHOLHDL, VLDL, LDLCALC, LDLDIRECT  Physical Exam:    VS:  BP 106/64 (BP Location: Left Arm, Patient Position: Sitting, Cuff Size: Large)  Pulse (!) 58   Ht 5\' 5"  (1.651 m)   Wt 294 lb (133.4 kg)   SpO2 98%   BMI 48.92 kg/m     Wt Readings from Last 3 Encounters:  04/06/18 294 lb (133.4 kg)  02/19/18 291 lb 1.9 oz (132.1 kg)  08/12/17 288 lb 6.4 oz (130.8 kg)     GEN:  Well nourished, well developed in no acute distress HEENT: Normal NECK: No JVD; No carotid bruits LYMPHATICS: No lymphadenopathy CARDIAC: RRR, no murmurs, rubs, gallops RESPIRATORY:  Clear to auscultation without rales, wheezing or rhonchi  ABDOMEN: Soft, non-tender, non-distended MUSCULOSKELETAL:  No edema; No deformity  SKIN: Warm and dry NEUROLOGIC:  Alert and oriented x 3 PSYCHIATRIC:  Normal affect    Signed, Norman HerrlichBrian , MD  04/06/2018 8:23 AM    [facility]

## 2018-04-06 ENCOUNTER — Ambulatory Visit (INDEPENDENT_AMBULATORY_CARE_PROVIDER_SITE_OTHER): Payer: PPO | Admitting: Cardiology

## 2018-04-06 ENCOUNTER — Encounter: Payer: Self-pay | Admitting: Cardiology

## 2018-04-06 VITALS — BP 106/64 | HR 58 | Ht 65.0 in | Wt 294.0 lb

## 2018-04-06 DIAGNOSIS — I480 Paroxysmal atrial fibrillation: Secondary | ICD-10-CM | POA: Diagnosis not present

## 2018-04-06 DIAGNOSIS — Z7901 Long term (current) use of anticoagulants: Secondary | ICD-10-CM | POA: Diagnosis not present

## 2018-04-06 DIAGNOSIS — Z79899 Other long term (current) drug therapy: Secondary | ICD-10-CM

## 2018-04-06 DIAGNOSIS — I119 Hypertensive heart disease without heart failure: Secondary | ICD-10-CM | POA: Diagnosis not present

## 2018-04-06 HISTORY — DX: Other long term (current) drug therapy: Z79.899

## 2018-04-06 NOTE — Patient Instructions (Addendum)
Medication Instructions:  Your physician recommends that you continue on your current medications as directed. Please refer to the Current Medication list given to you today.  If you need a refill on your cardiac medications before your next appointment, please call your pharmacy.   Lab work: NONE If you have labs (blood work) drawn today and your tests are completely normal, you will receive your results only by: Marland Kitchen. MyChart Message (if you have MyChart) OR . A paper copy in the mail If you have any lab test that is abnormal or we need to change your treatment, we will call you to review the results.  Testing/Procedures: NONE  Follow-Up: At [facility], you and your health needs are our priority.  As part of our continuing mission to provide you with exceptional heart care, we have created designated Provider Care Teams.  These Care Teams include your primary Cardiologist (physician) and Advanced Practice Providers (APPs -  Physician Assistants and Nurse Practitioners) who all work together to provide you with the care you need, when you need it. You will need a follow up appointment in 6 months.  Please call our office 2 months in advance to schedule this appointment.       1. Avoid all over-the-counter antihistamines except Claritin/Loratadine and Zyrtec/Cetrizine. 2. Avoid all combination including cold sinus allergies flu decongestant and sleep medications 3. You can use Robitussin DM Mucinex and Mucinex DM for cough. 4. can use Tylenol aspirin ibuprofen and naproxen but no combinations such as sleep or sinus.

## 2018-05-10 ENCOUNTER — Telehealth: Payer: Self-pay | Admitting: Cardiology

## 2018-05-10 DIAGNOSIS — R040 Epistaxis: Secondary | ICD-10-CM

## 2018-05-10 NOTE — Telephone Encounter (Signed)
Patient informed to hold eliquis for 2 days (4 doses total) per Dr. Dulce SellarMunley. Patient has not seen an ENT doctor in 2-3 years and she cannot remember who the doctor was that she saw before. Patient is agreeable to a referral to [place]. [place]. Referral placed and phone number provided to patient to call and schedule appointment tomorrow. Patient verbalized understanding. No further questions.

## 2018-05-10 NOTE — Telephone Encounter (Signed)
Patient reports she has been having nose bleeds for the past 2-3 weeks. Last night she began feeling blood running down her throat and then her nose started to bleed. She denies any other bleeding or bruising. She wants to know if she may need to adjust her eliquis. She currently takes 5 mg twice daily. Will route to Dr. Dulce Sellar for further instruction

## 2018-05-10 NOTE — Addendum Note (Signed)
Addended by: Crist Fat on: 05/10/2018 04:55 PM   Modules accepted: Orders

## 2018-05-10 NOTE — Telephone Encounter (Signed)
Thinks eliquis is making her nose bleed

## 2018-05-10 NOTE — Telephone Encounter (Signed)
Let us hold her anticoagulant for 2 days and she needs to be seen by ENT and likely needs cauterization of the bleeding site in her nose

## 2018-05-11 ENCOUNTER — Telehealth: Payer: Self-pay | Admitting: Cardiology

## 2018-05-11 NOTE — Telephone Encounter (Signed)
Pt callled and cant get in to see Dr Marcheta Grammes til 2/4 and she ha been holding her Eliquis and she needs to know what to do being that she is scheduled for knee surgery on the 4th.Gina Coffey

## 2018-05-11 NOTE — Telephone Encounter (Signed)
Left message to return call 

## 2018-05-12 NOTE — Telephone Encounter (Signed)
I think she is ready for surgery

## 2018-05-12 NOTE — Telephone Encounter (Signed)
Patient is scheduled for knee replacement surgery on 05/25/2018. Patient cannot get in to see Dr. Marcheta Grammes before her surgery date. Patient wants to know if she should hold her eliquis until she can be seen by Dr. Marcheta Grammes to discuss frequent nosebleeds or continue taking eliquis as prescribed. Will have Dr. Dulce Sellar advise and update patient accordingly.

## 2018-05-12 NOTE — Telephone Encounter (Signed)
Patient informed to take eliquis as prescribed unless nosebleeds reoccur per Dr. Hulen Shouts verbal order. Advised patient is she has another nosebleed to contact our office for further recommendations. Informed patient to go ahead and schedule an appointment with Dr. Marcheta Grammes for after her surgery. Patient informed that Dr. Dulce Sellar feels she is ready for surgery. Patient verbalized understanding. No further questions.

## 2018-05-13 ENCOUNTER — Other Ambulatory Visit: Payer: Self-pay | Admitting: Internal Medicine

## 2018-05-13 DIAGNOSIS — Z1231 Encounter for screening mammogram for malignant neoplasm of breast: Secondary | ICD-10-CM

## 2018-05-14 NOTE — Progress Notes (Signed)
Please place orders in epic pt. Has a preop 05-18-18 Thank you!

## 2018-05-14 NOTE — Progress Notes (Signed)
Clearance 05-12-18 telephone note epic Dr. Dulce Sellar   ekg 02-23-18 epic  Stress test 02-11-18 epic  Echo [id number] epic  cxr 02-10-18 epic

## 2018-05-14 NOTE — Patient Instructions (Signed)
Bynum BellowsJanice Robbins Wulff  05/14/2018   Your procedure is scheduled on: 05-25-18  [facility]  Report to admitting at              0530  AM    Call this number if you have problems the morning of surgery [phone]    Remember: Do not eat food or drink liquids :After Midnight.  BRUSH YOUR TEETH MORNING OF SURGERY AND RINSE YOUR MOUTH OUT, NO CHEWING GUM CANDY OR MINTS.     Take these medicines the morning of surgery with A SIP OF WATER: flecainide, diltiazem, zyrtec                                 You may not have any metal on your body including hair pins and              piercings  Do not wear jewelry, make-up, lotions, powders or perfumes, deodorant             Do not wear nail polish.  Do not shave  48 hours prior to surgery.     Do not bring valuables to the hospital. [facility] IS NOT             RESPONSIBLE   FOR VALUABLES.  Contacts, dentures or bridgework may not be worn into surgery.  Leave suitcase in the car. After surgery it may be brought to your room.               Please read over the following fact sheets you were given: _____________________________________________________________________          [facility] - Preparing for Surgery Before surgery, you can play an important role.  Because skin is not sterile, your skin needs to be as free of germs as possible.  You can reduce the number of germs on your skin by washing with CHG (chlorahexidine gluconate) soap before surgery.  CHG is an antiseptic cleaner which kills germs and bonds with the skin to continue killing germs even after washing. Please DO NOT use if you have an allergy to CHG or antibacterial soaps.  If your skin becomes reddened/irritated stop using the CHG and inform your nurse when you arrive at Short Stay. Do not shave (including legs and underarms) for at least 48 hours prior to the first CHG shower.  You may shave your face/neck. Please follow  these instructions carefully:  1.  Shower with CHG Soap the night before surgery and the  morning of Surgery.  2.  If you choose to wash your hair, wash your hair first as usual with your  normal  shampoo.  3.  After you shampoo, rinse your hair and body thoroughly to remove the  shampoo.                           4.  Use CHG as you would any other liquid soap.  You can apply chg directly  to the skin and wash                       Gently with a scrungie or clean washcloth.  5.  Apply the CHG Soap to your body ONLY FROM THE NECK DOWN.   Do not  use on face/ open                           Wound or open sores. Avoid contact with eyes, ears mouth and genitals (private parts).                       Wash face,  Genitals (private parts) with your normal soap.             6.  Wash thoroughly, paying special attention to the area where your surgery  will be performed.  7.  Thoroughly rinse your body with warm water from the neck down.  8.  DO NOT shower/wash with your normal soap after using and rinsing off  the CHG Soap.                9.  Pat yourself dry with a clean towel.            10.  Wear clean pajamas.            11.  Place clean sheets on your bed the night of your first shower and do not  sleep with pets. Day of Surgery : Do not apply any lotions/deodorants the morning of surgery.  Please wear clean clothes to the hospital/surgery center.  FAILURE TO FOLLOW THESE INSTRUCTIONS MAY RESULT IN THE CANCELLATION OF YOUR SURGERY PATIENT SIGNATURE_________________________________  NURSE SIGNATURE__________________________________  ________________________________________________________________________  WHAT IS A BLOOD TRANSFUSION? Blood Transfusion Information  A transfusion is the replacement of blood or some of its parts. Blood is made up of multiple cells which provide different functions.  Red blood cells carry oxygen and are used for blood loss replacement.  White blood cells fight  against infection.  Platelets control bleeding.  Plasma helps clot blood.  Other blood products are available for specialized needs, such as hemophilia or other clotting disorders. BEFORE THE TRANSFUSION  Who gives blood for transfusions?   Healthy volunteers who are fully evaluated to make sure their blood is safe. This is blood bank blood. Transfusion therapy is the safest it has ever been in the practice of medicine. Before blood is taken from a donor, a complete history is taken to make sure that person has no history of diseases nor engages in risky social behavior (examples are intravenous drug use or sexual activity with multiple partners). The donor's travel history is screened to minimize risk of transmitting infections, such as malaria. The donated blood is tested for signs of infectious diseases, such as HIV and hepatitis. The blood is then tested to be sure it is compatible with you in order to minimize the chance of a transfusion reaction. If you or a relative donates blood, this is often done in anticipation of surgery and is not appropriate for emergency situations. It takes many days to process the donated blood. RISKS AND COMPLICATIONS Although transfusion therapy is very safe and saves many lives, the main dangers of transfusion include:   Getting an infectious disease.  Developing a transfusion reaction. This is an allergic reaction to something in the blood you were given. Every precaution is taken to prevent this. The decision to have a blood transfusion has been considered carefully by your caregiver before blood is given. Blood is not given unless the benefits outweigh the risks. AFTER THE TRANSFUSION  Right after receiving a blood transfusion, you will usually feel much better and more energetic. This is especially true if  your red blood cells have gotten low (anemic). The transfusion raises the level of the red blood cells which carry oxygen, and this usually causes an  energy increase.  The nurse administering the transfusion will monitor you carefully for complications. HOME CARE INSTRUCTIONS  No special instructions are needed after a transfusion. You may find your energy is better. Speak with your caregiver about any limitations on activity for underlying diseases you may have. SEEK MEDICAL CARE IF:   Your condition is not improving after your transfusion.  You develop redness or irritation at the intravenous (IV) site. SEEK IMMEDIATE MEDICAL CARE IF:  Any of the following symptoms occur over the next 12 hours:  Shaking chills.  You have a temperature by mouth above 102 F (38.9 C), not controlled by medicine.  Chest, back, or muscle pain.  People around you feel you are not acting correctly or are confused.  Shortness of breath or difficulty breathing.  Dizziness and fainting.  You get a rash or develop hives.  You have a decrease in urine output.  Your urine turns a dark color or changes to pink, red, or brown. Any of the following symptoms occur over the next 10 days:  You have a temperature by mouth above 102 F (38.9 C), not controlled by medicine.  Shortness of breath.  Weakness after normal activity.  The white part of the eye turns yellow (jaundice).  You have a decrease in the amount of urine or are urinating less often.  Your urine turns a dark color or changes to pink, red, or brown. Document Released: 04/04/2000 Document Revised: 06/30/2011 Document Reviewed: 11/22/2007 ExitCare Patient Information 2014 [place], [place].  _______________________________________________________________________  Incentive Spirometer  An incentive spirometer is a tool that can help keep your lungs clear and active. This tool measures how well you are filling your lungs with each breath. Taking long deep breaths may help reverse or decrease the chance of developing breathing (pulmonary) problems (especially infection) following:  A  long period of time when you are unable to move or be active. BEFORE THE PROCEDURE   If the spirometer includes an indicator to show your best effort, your nurse or respiratory therapist will set it to a desired goal.  If possible, sit up straight or lean slightly forward. Try not to slouch.  Hold the incentive spirometer in an upright position. INSTRUCTIONS FOR USE  1. Sit on the edge of your bed if possible, or sit up as far as you can in bed or on a chair. 2. Hold the incentive spirometer in an upright position. 3. Breathe out normally. 4. Place the mouthpiece in your mouth and seal your lips tightly around it. 5. Breathe in slowly and as deeply as possible, raising the piston or the ball toward the top of the column. 6. Hold your breath for 3-5 seconds or for as long as possible. Allow the piston or ball to fall to the bottom of the column. 7. Remove the mouthpiece from your mouth and breathe out normally. 8. Rest for a few seconds and repeat Steps 1 through 7 at least 10 times every 1-2 hours when you are awake. Take your time and take a few normal breaths between deep breaths. 9. The spirometer may include an indicator to show your best effort. Use the indicator as a goal to work toward during each repetition. 10. After each set of 10 deep breaths, practice coughing to be sure your lungs are clear. If you have an incision (  the cut made at the time of surgery), support your incision when coughing by placing a pillow or rolled up towels firmly against it. Once you are able to get out of bed, walk around indoors and cough well. You may stop using the incentive spirometer when instructed by your caregiver.  RISKS AND COMPLICATIONS  Take your time so you do not get dizzy or light-headed.  If you are in pain, you may need to take or ask for pain medication before doing incentive spirometry. It is harder to take a deep breath if you are having pain. AFTER USE  Rest and breathe slowly and  easily.  It can be helpful to keep track of a log of your progress. Your caregiver can provide you with a simple table to help with this. If you are using the spirometer at home, follow these instructions: SEEK MEDICAL CARE IF:   You are having difficultly using the spirometer.  You have trouble using the spirometer as often as instructed.  Your pain medication is not giving enough relief while using the spirometer.  You develop fever of 100.5 F (38.1 C) or higher. SEEK IMMEDIATE MEDICAL CARE IF:   You cough up bloody sputum that had not been present before.  You develop fever of 102 F (38.9 C) or greater.  You develop worsening pain at or near the incision site. MAKE SURE YOU:   Understand these instructions.  Will watch your condition.  Will get help right away if you are not doing well or get worse. Document Released: 08/18/2006 Document Revised: 06/30/2011 Document Reviewed: 10/19/2006 [facility] Patient Information 2014 [place], [place].   ________________________________________________________________________

## 2018-05-18 ENCOUNTER — Encounter (HOSPITAL_COMMUNITY): Payer: Self-pay

## 2018-05-18 ENCOUNTER — Other Ambulatory Visit: Payer: Self-pay

## 2018-05-18 ENCOUNTER — Encounter (HOSPITAL_COMMUNITY)
Admission: RE | Admit: 2018-05-18 | Discharge: 2018-05-18 | Disposition: A | Discharge status: Home / Self Care | Payer: PPO | Source: Ambulatory Visit | Attending: Orthopedic Surgery | Admitting: Orthopedic Surgery

## 2018-05-18 DIAGNOSIS — Z01812 Encounter for preprocedural laboratory examination: Secondary | ICD-10-CM | POA: Diagnosis not present

## 2018-05-18 HISTORY — DX: Family history of other specified conditions: Z84.89

## 2018-05-18 LAB — CBC
HCT: 44.6 % (ref 36.0–46.0)
HEMOGLOBIN: 14.3 g/dL (ref 12.0–15.0)
MCH: 29.7 pg (ref 26.0–34.0)
MCHC: 32.1 g/dL (ref 30.0–36.0)
MCV: 92.5 fL (ref 80.0–100.0)
NRBC: 0 % (ref 0.0–0.2)
Platelets: 292 10*3/uL (ref 150–400)
RBC: 4.82 MIL/uL (ref 3.87–5.11)
RDW: 13.5 % (ref 11.5–15.5)
WBC: 8.5 10*3/uL (ref 4.0–10.5)

## 2018-05-18 LAB — SURGICAL PCR SCREEN
MRSA, PCR: NEGATIVE
STAPHYLOCOCCUS AUREUS: NEGATIVE

## 2018-05-19 ENCOUNTER — Other Ambulatory Visit: Payer: Self-pay | Admitting: Orthopedic Surgery

## 2018-05-20 ENCOUNTER — Ambulatory Visit
Admission: RE | Admit: 2018-05-20 | Discharge: 2018-05-20 | Disposition: A | Discharge status: Home / Self Care | Payer: PPO | Source: Ambulatory Visit | Attending: Internal Medicine | Admitting: Internal Medicine

## 2018-05-20 DIAGNOSIS — Z1231 Encounter for screening mammogram for malignant neoplasm of breast: Secondary | ICD-10-CM | POA: Diagnosis not present

## 2018-05-24 MED ORDER — DEXTROSE 5 % IV SOLN
3.0000 g | INTRAVENOUS | Status: AC
  Administered 2018-05-25: 3 g via INTRAVENOUS
  Filled 2018-05-24: qty 3

## 2018-05-24 NOTE — Progress Notes (Signed)
Anesthesia Chart Review   Case:  [id number] Date/Time:  05/25/18 0715   Procedure:  TOTAL KNEE ARTHROPLASTY (Right )   Anesthesia type:  Choice   Pre-op diagnosis:  DJD RIGHT KNEE   Location:  [facility] / [facility]   Surgeon:  Teryl Lucy, MD      DISCUSSION: 68 yo never smoker with h/o A-fib (on Eliquis), anemia, HTN, depression, anxiety, right knee DJD scheduled for above surgery on 05/25/18 with Dr. Teryl Lucy.  Pt last seen by cardiology, Dr. Norman Herrlich, on 04/07/2019.  Pt stable at this visit with no changes to medications, 6 month follow up recommended. Per Dr. Dulce Sellar pt to hold Eliquis 2 days prior to surgery.  Per telephone encounter on 05/12/18 Dr. Dulce Sellar states, "I think she is ready for surgery."  Pt can proceed with planned procedure barring acute status change.  VS: BP (!) 138/92   Pulse 63   Temp 36.8 C (Oral)   Resp 16   Ht 5\' 5"  (1.651 m)   Wt 132.5 kg   SpO2 99%   BMI 48.59 kg/m   PROVIDERS: Philemon Kingdom, MD is PCP    LABS: Labs reviewed: Acceptable for surgery. (all labs ordered are listed, but only abnormal results are displayed)  Labs Reviewed  SURGICAL PCR SCREEN  CBC     IMAGES:   EKG: 02/23/2018 Rate 57 bpm Minimal voltage criteria for LVH, may be normal variant  Borderline ECG  CV: Stress Test 02/10/2018 Impression:  1. No reversible ischemia or infarction 2. Normal left ventricular wall motion Left ventricular ejection fraction 84% Past Medical History:  Diagnosis Date  . Anemia    hx of  . Anxiety    pt. denies  . Arthritis   . Depression   . Dysrhythmia    atrial fibrillation  . Family history of adverse reaction to anesthesia    brother aspirated and died . Mother confused after anesthesia  . Hypercholesteremia 08/05/2017  . Hypertension   . PAF (paroxysmal atrial fibrillation) (HCC) 04/29/2015   Overview:  Brief, converted to [facility] while in [facility] ED, CBC and CMP were normal, CHADS2 vasc score= 3, was discharged on a  beta blocker and asprin  . Palpitations 08/05/2017  . Pneumonia    in 2006  . Primary localized osteoarthritis of left knee 12/09/2016  . S/P knee replacement 12/09/2016   left  . Severe obesity (BMI >= 40) (HCC) 12/09/2016    Past Surgical History:  Procedure Laterality Date  . ABDOMINAL HYSTERECTOMY     1998  . APPENDECTOMY    . CARPAL TUNNEL RELEASE Right 2005  . DILATION AND CURETTAGE OF UTERUS  11/26/2016   1974x2  . TOTAL KNEE ARTHROPLASTY Left 12/09/2016   Procedure: TOTAL KNEE ARTHROPLASTY;  Surgeon: Teryl Lucy, MD;  Location: [facility] OR;  Service: Orthopedics;  Laterality: Left;    MEDICATIONS: . cetirizine (ZYRTEC) 10 MG tablet  . cholecalciferol (VITAMIN D) 1000 units tablet  . diltiazem (CARDIZEM CD) 120 MG 24 hr capsule  . ELIQUIS 5 MG TABS tablet  . EPINEPHrine (EPIPEN 2-PAK) 0.3 mg/0.3 mL IJ SOAJ injection  . flecainide (TAMBOCOR) 50 MG tablet  . lisinopril-hydrochlorothiazide (PRINZIDE,ZESTORETIC) 20-12.5 MG tablet  . Omega-3 Fatty Acids (FISH OIL) 1000 MG CAPS  . rosuvastatin (CRESTOR) 20 MG tablet  . sertraline (ZOLOFT) 50 MG tablet  . traMADol (ULTRAM) 50 MG tablet  . vitamin E 400 UNIT capsule   No current facility-administered medications for this encounter.    . [  START ON 05/25/2018] ceFAZolin (ANCEF) 3 g in dextrose 5 % 50 mL IVPB    Janey Genta [facility] Pre-Surgical Testing [phone] 05/24/18 11:28 AM

## 2018-05-24 NOTE — Anesthesia Preprocedure Evaluation (Addendum)
Anesthesia Evaluation  Patient identified by MRN, date of birth, ID band Patient awake    Reviewed: Allergy & Precautions, NPO status , Patient's Chart, lab work & pertinent test results  Airway Mallampati: II  TM Distance: >3 FB Neck ROM: Full    Dental no notable dental hx.    Pulmonary neg pulmonary ROS,    Pulmonary exam normal breath sounds clear to auscultation       Cardiovascular hypertension, Normal cardiovascular exam+ dysrhythmias Atrial Fibrillation  Rhythm:Regular Rate:Normal     Neuro/Psych negative neurological ROS  negative psych ROS   GI/Hepatic negative GI ROS, Neg liver ROS,   Endo/Other  Morbid obesity  Renal/GU negative Renal ROS  negative genitourinary   Musculoskeletal  (+) Arthritis , Osteoarthritis,    Abdominal   Peds negative pediatric ROS (+)  Hematology negative hematology ROS (+)   Anesthesia Other Findings   Reproductive/Obstetrics negative OB ROS                            Anesthesia Physical Anesthesia Plan  ASA: III  Anesthesia Plan: Spinal   Post-op Pain Management:  Regional for Post-op pain   Induction: Intravenous  PONV Risk Score and Plan: 1 and Ondansetron and Dexamethasone  Airway Management Planned: Simple Face Mask  Additional Equipment:   Intra-op Plan:   Post-operative Plan:   Informed Consent: I have reviewed the patients History and Physical, chart, labs and discussed the procedure including the risks, benefits and alternatives for the proposed anesthesia with the patient or authorized representative who has indicated his/her understanding and acceptance.     Dental advisory given  Plan Discussed with: CRNA and Surgeon  Anesthesia Plan Comments: (See PST note 05/18/2018, Jodell Cipro, PA-C)       Anesthesia Quick Evaluation

## 2018-05-25 ENCOUNTER — Inpatient Hospital Stay (HOSPITAL_COMMUNITY): Payer: PPO | Admitting: Anesthesiology

## 2018-05-25 ENCOUNTER — Inpatient Hospital Stay (HOSPITAL_COMMUNITY): Payer: PPO

## 2018-05-25 ENCOUNTER — Inpatient Hospital Stay (HOSPITAL_COMMUNITY)
Admission: RE | Admit: 2018-05-25 | Discharge: 2018-05-26 | DRG: 470 | Disposition: A | Discharge status: Home Health | Payer: PPO | Attending: Orthopedic Surgery | Admitting: Orthopedic Surgery

## 2018-05-25 ENCOUNTER — Inpatient Hospital Stay (HOSPITAL_COMMUNITY): Payer: PPO | Admitting: Physician Assistant

## 2018-05-25 ENCOUNTER — Encounter (HOSPITAL_COMMUNITY): Payer: Self-pay | Admitting: Anesthesiology

## 2018-05-25 ENCOUNTER — Other Ambulatory Visit: Payer: Self-pay

## 2018-05-25 ENCOUNTER — Encounter (HOSPITAL_COMMUNITY)
Admission: RE | Disposition: A | Discharge status: Home Health | Payer: Self-pay | Source: Home / Self Care | Attending: Orthopedic Surgery

## 2018-05-25 DIAGNOSIS — G8918 Other acute postprocedural pain: Secondary | ICD-10-CM | POA: Diagnosis not present

## 2018-05-25 DIAGNOSIS — Z79891 Long term (current) use of opiate analgesic: Secondary | ICD-10-CM

## 2018-05-25 DIAGNOSIS — Z96652 Presence of left artificial knee joint: Secondary | ICD-10-CM | POA: Diagnosis present

## 2018-05-25 DIAGNOSIS — I480 Paroxysmal atrial fibrillation: Secondary | ICD-10-CM | POA: Diagnosis present

## 2018-05-25 DIAGNOSIS — Z471 Aftercare following joint replacement surgery: Secondary | ICD-10-CM | POA: Diagnosis not present

## 2018-05-25 DIAGNOSIS — F329 Major depressive disorder, single episode, unspecified: Secondary | ICD-10-CM | POA: Diagnosis not present

## 2018-05-25 DIAGNOSIS — Z6841 Body Mass Index (BMI) 40.0 and over, adult: Secondary | ICD-10-CM

## 2018-05-25 DIAGNOSIS — Z7901 Long term (current) use of anticoagulants: Secondary | ICD-10-CM

## 2018-05-25 DIAGNOSIS — Z96651 Presence of right artificial knee joint: Secondary | ICD-10-CM | POA: Diagnosis not present

## 2018-05-25 DIAGNOSIS — M1711 Unilateral primary osteoarthritis, right knee: Principal | ICD-10-CM

## 2018-05-25 DIAGNOSIS — Z91018 Allergy to other foods: Secondary | ICD-10-CM | POA: Diagnosis not present

## 2018-05-25 DIAGNOSIS — M25761 Osteophyte, right knee: Secondary | ICD-10-CM | POA: Diagnosis not present

## 2018-05-25 DIAGNOSIS — Z79899 Other long term (current) drug therapy: Secondary | ICD-10-CM | POA: Diagnosis not present

## 2018-05-25 DIAGNOSIS — I10 Essential (primary) hypertension: Secondary | ICD-10-CM | POA: Diagnosis not present

## 2018-05-25 DIAGNOSIS — E7800 Pure hypercholesterolemia, unspecified: Secondary | ICD-10-CM | POA: Diagnosis not present

## 2018-05-25 DIAGNOSIS — Z96659 Presence of unspecified artificial knee joint: Secondary | ICD-10-CM

## 2018-05-25 HISTORY — PX: TOTAL KNEE ARTHROPLASTY: SHX125

## 2018-05-25 HISTORY — DX: Unilateral primary osteoarthritis, right knee: M17.11

## 2018-05-25 LAB — BASIC METABOLIC PANEL
Anion gap: 9 (ref 5–15)
BUN: 23 mg/dL (ref 8–23)
CO2: 24 mmol/L (ref 22–32)
Calcium: 9.3 mg/dL (ref 8.9–10.3)
Chloride: 105 mmol/L (ref 98–111)
Creatinine, Ser: 0.92 mg/dL (ref 0.44–1.00)
GFR calc Af Amer: >60 mL/min (ref >60)
GFR calc non Af Amer: >60 mL/min (ref >60)
Glucose, Bld: 95 mg/dL (ref 70–99)
Potassium: 3.9 mmol/L (ref 3.5–5.1)
Sodium: 138 mmol/L (ref 135–145)

## 2018-05-25 SURGERY — ARTHROPLASTY, KNEE, TOTAL
Anesthesia: Spinal | Site: Knee | Laterality: Right

## 2018-05-25 MED ORDER — MIDAZOLAM HCL 5 MG/5ML IJ SOLN
INTRAMUSCULAR | Status: DC | PRN
  Administered 2018-05-25: 2 mg via INTRAVENOUS

## 2018-05-25 MED ORDER — TRANEXAMIC ACID-NACL 1000-0.7 MG/100ML-% IV SOLN
1000.0000 mg | INTRAVENOUS | Status: AC
  Administered 2018-05-25: 1000 mg via INTRAVENOUS
  Filled 2018-05-25: qty 100

## 2018-05-25 MED ORDER — LACTATED RINGERS IV SOLN
INTRAVENOUS | Status: DC
  Administered 2018-05-25 (×2): via INTRAVENOUS

## 2018-05-25 MED ORDER — FENTANYL CITRATE (PF) 100 MCG/2ML IJ SOLN
INTRAMUSCULAR | Status: DC | PRN
  Administered 2018-05-25 (×2): 50 ug via INTRAVENOUS

## 2018-05-25 MED ORDER — ONDANSETRON HCL 4 MG/2ML IJ SOLN
INTRAMUSCULAR | Status: DC | PRN
  Administered 2018-05-25: 4 mg via INTRAVENOUS

## 2018-05-25 MED ORDER — APIXABAN 5 MG PO TABS
5.0000 mg | ORAL_TABLET | Freq: Two times a day (BID) | ORAL | Status: DC
  Administered 2018-05-26: 5 mg via ORAL
  Filled 2018-05-25: qty 1

## 2018-05-25 MED ORDER — BACLOFEN 10 MG PO TABS: 10.0000 mg | ORAL_TABLET | Freq: Three times a day (TID) | ORAL | 0 refills | Status: DC

## 2018-05-25 MED ORDER — LISINOPRIL-HYDROCHLOROTHIAZIDE 20-12.5 MG PO TABS: 1.0000 | ORAL_TABLET | Freq: Every day | ORAL | Status: DC

## 2018-05-25 MED ORDER — FENTANYL CITRATE (PF) 100 MCG/2ML IJ SOLN
INTRAMUSCULAR | Status: AC
  Filled 2018-05-25: qty 2

## 2018-05-25 MED ORDER — BUPIVACAINE HCL (PF) 0.25 % IJ SOLN
INTRAMUSCULAR | Status: AC
  Filled 2018-05-25: qty 30

## 2018-05-25 MED ORDER — SERTRALINE HCL 50 MG PO TABS
50.0000 mg | ORAL_TABLET | Freq: Every day | ORAL | Status: DC
  Administered 2018-05-25 – 2018-05-26 (×2): 50 mg via ORAL
  Filled 2018-05-25 (×2): qty 1

## 2018-05-25 MED ORDER — HYDROMORPHONE HCL 1 MG/ML IJ SOLN: 0.5000 mg | INTRAMUSCULAR | Status: DC | PRN

## 2018-05-25 MED ORDER — HYDROCHLOROTHIAZIDE 12.5 MG PO CAPS
12.5000 mg | ORAL_CAPSULE | Freq: Every day | ORAL | Status: DC
  Administered 2018-05-26: 12.5 mg via ORAL
  Filled 2018-05-25: qty 1

## 2018-05-25 MED ORDER — EPHEDRINE SULFATE-NACL 50-0.9 MG/10ML-% IV SOSY
PREFILLED_SYRINGE | INTRAVENOUS | Status: DC | PRN
  Administered 2018-05-25 (×4): 10 mg via INTRAVENOUS

## 2018-05-25 MED ORDER — VITAMIN E 180 MG (400 UNIT) PO CAPS
400.0000 [IU] | ORAL_CAPSULE | Freq: Every day | ORAL | Status: DC
  Administered 2018-05-25 – 2018-05-26 (×2): 400 [IU] via ORAL
  Filled 2018-05-25 (×2): qty 1

## 2018-05-25 MED ORDER — SODIUM CHLORIDE 0.9 % IR SOLN
Status: DC | PRN
  Administered 2018-05-25: 1000 mL

## 2018-05-25 MED ORDER — TRANEXAMIC ACID-NACL 1000-0.7 MG/100ML-% IV SOLN
1000.0000 mg | Freq: Once | INTRAVENOUS | Status: AC
  Administered 2018-05-25: 1000 mg via INTRAVENOUS
  Filled 2018-05-25: qty 100

## 2018-05-25 MED ORDER — SENNA-DOCUSATE SODIUM 8.6-50 MG PO TABS: 2.0000 | ORAL_TABLET | Freq: Every day | ORAL | 1 refills | Status: DC

## 2018-05-25 MED ORDER — METHOCARBAMOL 500 MG IVPB - SIMPLE MED
INTRAVENOUS | Status: AC
  Filled 2018-05-25: qty 50

## 2018-05-25 MED ORDER — DEXAMETHASONE SODIUM PHOSPHATE 10 MG/ML IJ SOLN
INTRAMUSCULAR | Status: DC | PRN
  Administered 2018-05-25: 10 mg via INTRAVENOUS

## 2018-05-25 MED ORDER — POTASSIUM CHLORIDE IN NACL 20-0.45 MEQ/L-% IV SOLN
INTRAVENOUS | Status: DC
  Administered 2018-05-25 – 2018-05-26 (×2): via INTRAVENOUS
  Filled 2018-05-25 (×2): qty 1000

## 2018-05-25 MED ORDER — CHLORHEXIDINE GLUCONATE 4 % EX LIQD: 60.0000 mL | Freq: Once | CUTANEOUS | Status: DC

## 2018-05-25 MED ORDER — PROPOFOL 10 MG/ML IV BOLUS
INTRAVENOUS | Status: AC
  Filled 2018-05-25: qty 60

## 2018-05-25 MED ORDER — PHENOL 1.4 % MT LIQD
1.0000 | OROMUCOSAL | Status: DC | PRN
  Filled 2018-05-25: qty 177

## 2018-05-25 MED ORDER — DEXAMETHASONE SODIUM PHOSPHATE 10 MG/ML IJ SOLN
INTRAMUSCULAR | Status: AC
  Filled 2018-05-25: qty 1

## 2018-05-25 MED ORDER — BUPIVACAINE IN DEXTROSE 0.75-8.25 % IT SOLN
INTRATHECAL | Status: DC | PRN
  Administered 2018-05-25: 1.8 mL via INTRATHECAL

## 2018-05-25 MED ORDER — BUPIVACAINE HCL 0.25 % IJ SOLN
INTRAMUSCULAR | Status: DC | PRN
  Administered 2018-05-25: 30 mL

## 2018-05-25 MED ORDER — OXYCODONE HCL 5 MG PO TABS
5.0000 mg | ORAL_TABLET | ORAL | Status: DC | PRN
  Administered 2018-05-25: 10 mg via ORAL
  Administered 2018-05-26 (×2): 5 mg via ORAL
  Filled 2018-05-25: qty 1
  Filled 2018-05-25 (×2): qty 2

## 2018-05-25 MED ORDER — DEXAMETHASONE SODIUM PHOSPHATE 10 MG/ML IJ SOLN
10.0000 mg | Freq: Once | INTRAMUSCULAR | Status: AC
  Administered 2018-05-26: 10 mg via INTRAVENOUS
  Filled 2018-05-25: qty 1

## 2018-05-25 MED ORDER — ACETAMINOPHEN 325 MG PO TABS: 325.0000 mg | ORAL_TABLET | Freq: Four times a day (QID) | ORAL | Status: DC | PRN

## 2018-05-25 MED ORDER — OXYCODONE HCL 5 MG PO TABS: 10.0000 mg | ORAL_TABLET | ORAL | Status: DC | PRN

## 2018-05-25 MED ORDER — MAGNESIUM CITRATE PO SOLN: 1.0000 | Freq: Once | ORAL | Status: DC | PRN

## 2018-05-25 MED ORDER — EPHEDRINE 5 MG/ML INJ
INTRAVENOUS | Status: AC
  Filled 2018-05-25: qty 10

## 2018-05-25 MED ORDER — PROPOFOL 10 MG/ML IV BOLUS
INTRAVENOUS | Status: AC
  Filled 2018-05-25: qty 20

## 2018-05-25 MED ORDER — MENTHOL 3 MG MT LOZG: 1.0000 | LOZENGE | OROMUCOSAL | Status: DC | PRN

## 2018-05-25 MED ORDER — OXYCODONE HCL 5 MG PO TABS: 5.0000 mg | ORAL_TABLET | ORAL | 0 refills | Status: DC | PRN

## 2018-05-25 MED ORDER — METOCLOPRAMIDE HCL 5 MG PO TABS: 5.0000 mg | ORAL_TABLET | Freq: Three times a day (TID) | ORAL | Status: DC | PRN

## 2018-05-25 MED ORDER — PROPOFOL 10 MG/ML IV BOLUS
INTRAVENOUS | Status: DC | PRN
  Administered 2018-05-25 (×2): 20 mg via INTRAVENOUS

## 2018-05-25 MED ORDER — HYDROMORPHONE HCL 1 MG/ML IJ SOLN
INTRAMUSCULAR | Status: AC
  Filled 2018-05-25: qty 1

## 2018-05-25 MED ORDER — ACETAMINOPHEN 500 MG PO TABS
1000.0000 mg | ORAL_TABLET | Freq: Four times a day (QID) | ORAL | Status: AC
  Administered 2018-05-25 – 2018-05-26 (×4): 1000 mg via ORAL
  Filled 2018-05-25 (×4): qty 2

## 2018-05-25 MED ORDER — CLONIDINE HCL (ANALGESIA) 100 MCG/ML EP SOLN
EPIDURAL | Status: DC | PRN
  Administered 2018-05-25: 70 ug

## 2018-05-25 MED ORDER — LIDOCAINE 2% (20 MG/ML) 5 ML SYRINGE
INTRAMUSCULAR | Status: AC
  Filled 2018-05-25: qty 5

## 2018-05-25 MED ORDER — WATER FOR IRRIGATION, STERILE IR SOLN
Status: DC | PRN
  Administered 2018-05-25: 2000 mL via SURGICAL_CAVITY

## 2018-05-25 MED ORDER — POLYETHYLENE GLYCOL 3350 17 G PO PACK: 17.0000 g | PACK | Freq: Every day | ORAL | Status: DC | PRN

## 2018-05-25 MED ORDER — LISINOPRIL 20 MG PO TABS
20.0000 mg | ORAL_TABLET | Freq: Every day | ORAL | Status: DC
  Administered 2018-05-26: 20 mg via ORAL
  Filled 2018-05-25: qty 1

## 2018-05-25 MED ORDER — BISACODYL 10 MG RE SUPP: 10.0000 mg | Freq: Every day | RECTAL | Status: DC | PRN

## 2018-05-25 MED ORDER — DOCUSATE SODIUM 100 MG PO CAPS
100.0000 mg | ORAL_CAPSULE | Freq: Two times a day (BID) | ORAL | Status: DC
  Administered 2018-05-25 – 2018-05-26 (×2): 100 mg via ORAL
  Filled 2018-05-25 (×2): qty 1

## 2018-05-25 MED ORDER — ONDANSETRON HCL 4 MG PO TABS: 4.0000 mg | ORAL_TABLET | Freq: Four times a day (QID) | ORAL | Status: DC | PRN

## 2018-05-25 MED ORDER — KETOROLAC TROMETHAMINE 30 MG/ML IJ SOLN
INTRAMUSCULAR | Status: DC | PRN
  Administered 2018-05-25: 30 mg

## 2018-05-25 MED ORDER — PROPOFOL 500 MG/50ML IV EMUL
INTRAVENOUS | Status: DC | PRN
  Administered 2018-05-25: 100 ug/kg/min via INTRAVENOUS

## 2018-05-25 MED ORDER — KETOROLAC TROMETHAMINE 30 MG/ML IJ SOLN
INTRAMUSCULAR | Status: AC
  Filled 2018-05-25: qty 1

## 2018-05-25 MED ORDER — HYDROMORPHONE HCL 1 MG/ML IJ SOLN
0.2500 mg | INTRAMUSCULAR | Status: DC | PRN
  Administered 2018-05-25: 0.5 mg via INTRAVENOUS

## 2018-05-25 MED ORDER — METHOCARBAMOL 500 MG IVPB - SIMPLE MED
500.0000 mg | Freq: Four times a day (QID) | INTRAVENOUS | Status: DC | PRN
  Administered 2018-05-25: 500 mg via INTRAVENOUS
  Filled 2018-05-25: qty 50

## 2018-05-25 MED ORDER — DIPHENHYDRAMINE HCL 12.5 MG/5ML PO ELIX: 12.5000 mg | ORAL_SOLUTION | ORAL | Status: DC | PRN

## 2018-05-25 MED ORDER — ONDANSETRON HCL 4 MG/2ML IJ SOLN: 4.0000 mg | Freq: Four times a day (QID) | INTRAMUSCULAR | Status: DC | PRN

## 2018-05-25 MED ORDER — SODIUM CHLORIDE (PF) 0.9 % IJ SOLN
INTRAMUSCULAR | Status: AC
  Filled 2018-05-25: qty 50

## 2018-05-25 MED ORDER — FLECAINIDE ACETATE 50 MG PO TABS
50.0000 mg | ORAL_TABLET | Freq: Two times a day (BID) | ORAL | Status: DC
  Administered 2018-05-25 – 2018-05-26 (×2): 50 mg via ORAL
  Filled 2018-05-25 (×2): qty 1

## 2018-05-25 MED ORDER — ROSUVASTATIN CALCIUM 20 MG PO TABS
20.0000 mg | ORAL_TABLET | Freq: Every evening | ORAL | Status: DC
  Administered 2018-05-25: 20 mg via ORAL
  Filled 2018-05-25: qty 1

## 2018-05-25 MED ORDER — PROMETHAZINE HCL 25 MG/ML IJ SOLN: 6.2500 mg | INTRAMUSCULAR | Status: DC | PRN

## 2018-05-25 MED ORDER — VITAMIN D 25 MCG (1000 UNIT) PO TABS
1000.0000 [IU] | ORAL_TABLET | Freq: Every day | ORAL | Status: DC
  Administered 2018-05-25 – 2018-05-26 (×2): 1000 [IU] via ORAL
  Filled 2018-05-25 (×2): qty 1

## 2018-05-25 MED ORDER — ROPIVACAINE HCL 7.5 MG/ML IJ SOLN
INTRAMUSCULAR | Status: DC | PRN
  Administered 2018-05-25: 20 mL via PERINEURAL

## 2018-05-25 MED ORDER — LORATADINE 10 MG PO TABS
10.0000 mg | ORAL_TABLET | Freq: Every day | ORAL | Status: DC
  Administered 2018-05-26: 10 mg via ORAL
  Filled 2018-05-25: qty 1

## 2018-05-25 MED ORDER — EPINEPHRINE 0.3 MG/0.3ML IJ SOAJ
0.3000 mg | Freq: Every day | INTRAMUSCULAR | Status: DC | PRN
  Filled 2018-05-25: qty 0.3

## 2018-05-25 MED ORDER — ONDANSETRON HCL 4 MG/2ML IJ SOLN
INTRAMUSCULAR | Status: AC
  Filled 2018-05-25: qty 2

## 2018-05-25 MED ORDER — METOCLOPRAMIDE HCL 5 MG/ML IJ SOLN: 5.0000 mg | Freq: Three times a day (TID) | INTRAMUSCULAR | Status: DC | PRN

## 2018-05-25 MED ORDER — CEFAZOLIN SODIUM-DEXTROSE 2-4 GM/100ML-% IV SOLN
2.0000 g | Freq: Four times a day (QID) | INTRAVENOUS | Status: AC
  Administered 2018-05-25 (×2): 2 g via INTRAVENOUS
  Filled 2018-05-25 (×2): qty 100

## 2018-05-25 MED ORDER — METHOCARBAMOL 500 MG PO TABS: 500.0000 mg | ORAL_TABLET | Freq: Four times a day (QID) | ORAL | Status: DC | PRN

## 2018-05-25 MED ORDER — ZOLPIDEM TARTRATE 5 MG PO TABS: 5.0000 mg | ORAL_TABLET | Freq: Every evening | ORAL | Status: DC | PRN

## 2018-05-25 MED ORDER — MIDAZOLAM HCL 2 MG/2ML IJ SOLN
INTRAMUSCULAR | Status: AC
  Filled 2018-05-25: qty 2

## 2018-05-25 MED ORDER — DILTIAZEM HCL ER COATED BEADS 120 MG PO CP24: 120.0000 mg | ORAL_CAPSULE | ORAL | Status: DC | PRN

## 2018-05-25 MED ORDER — TRAMADOL HCL 50 MG PO TABS: 50.0000 mg | ORAL_TABLET | Freq: Four times a day (QID) | ORAL | Status: DC | PRN

## 2018-05-25 MED ORDER — DIPHENHYDRAMINE HCL 50 MG/ML IJ SOLN
INTRAMUSCULAR | Status: AC
  Filled 2018-05-25: qty 1

## 2018-05-25 MED ORDER — ALUM & MAG HYDROXIDE-SIMETH 200-200-20 MG/5ML PO SUSP: 30.0000 mL | ORAL | Status: DC | PRN

## 2018-05-25 MED ORDER — LIDOCAINE 2% (20 MG/ML) 5 ML SYRINGE
INTRAMUSCULAR | Status: DC | PRN
  Administered 2018-05-25: 100 mg via INTRAVENOUS

## 2018-05-25 SURGICAL SUPPLY — 62 items
BAG ZIPLOCK 12X15 (MISCELLANEOUS) ×3 IMPLANT
BANDAGE ACE 6X5 VEL STRL LF (GAUZE/BANDAGES/DRESSINGS) ×3 IMPLANT
BLADE SAG 18X100X1.27 (BLADE) ×6 IMPLANT
BLADE SURG 15 STRL LF DISP TIS (BLADE) ×1 IMPLANT
BLADE SURG 15 STRL SS (BLADE) ×2
BLADE SURG SZ10 CARB STEEL (BLADE) ×6 IMPLANT
BOWL SMART MIX CTS (DISPOSABLE) ×3 IMPLANT
CEMENT BONE R 1X40 (Cement) ×6 IMPLANT
CLOSURE STERI-STRIP 1/2X4 (GAUZE/BANDAGES/DRESSINGS) ×1
CLSR STERI-STRIP ANTIMIC 1/2X4 (GAUZE/BANDAGES/DRESSINGS) ×2 IMPLANT
COMP FEM VG IL 70 RT (Knees) ×3 IMPLANT
COMPONENT FEM VG IL 70 RT (Knees) ×1 IMPLANT
COVER SURGICAL LIGHT HANDLE (MISCELLANEOUS) ×3 IMPLANT
CUFF TOURN SGL QUICK 34 (TOURNIQUET CUFF) ×2
CUFF TRNQT CYL 34X4X40X1 (TOURNIQUET CUFF) ×1 IMPLANT
DECANTER SPIKE VIAL GLASS SM (MISCELLANEOUS) ×3 IMPLANT
DISTAL FEMORAL PEG (Knees) ×3 IMPLANT
DRAPE U-SHAPE 47X51 STRL (DRAPES) ×3 IMPLANT
DRESSING AQUACEL AG SP 3.5X10 (GAUZE/BANDAGES/DRESSINGS) ×1 IMPLANT
DRSG AQUACEL AG SP 3.5X10 (GAUZE/BANDAGES/DRESSINGS) ×3
DRSG MEPILEX BORDER 4X8 (GAUZE/BANDAGES/DRESSINGS) ×3 IMPLANT
DURAPREP 26ML APPLICATOR (WOUND CARE) ×6 IMPLANT
ELECT REM PT RETURN 15FT ADLT (MISCELLANEOUS) ×3 IMPLANT
GLOVE BIO SURGEON STRL SZ 6.5 (GLOVE) ×2 IMPLANT
GLOVE BIO SURGEON STRL SZ7.5 (GLOVE) ×3 IMPLANT
GLOVE BIO SURGEON STRL SZ8 (GLOVE) ×3 IMPLANT
GLOVE BIO SURGEONS STRL SZ 6.5 (GLOVE) ×1
GLOVE BIOGEL PI IND STRL 6.5 (GLOVE) ×1 IMPLANT
GLOVE BIOGEL PI IND STRL 7.5 (GLOVE) ×1 IMPLANT
GLOVE BIOGEL PI IND STRL 8 (GLOVE) ×2 IMPLANT
GLOVE BIOGEL PI IND STRL 9 (GLOVE) ×1 IMPLANT
GLOVE BIOGEL PI INDICATOR 6.5 (GLOVE) ×2
GLOVE BIOGEL PI INDICATOR 7.5 (GLOVE) ×2
GLOVE BIOGEL PI INDICATOR 8 (GLOVE) ×4
GLOVE BIOGEL PI INDICATOR 9 (GLOVE) ×2
GLOVE SURG ORTHO 9.0 STRL STRW (GLOVE) ×3 IMPLANT
GLOVE SURG SS PI 7.5 STRL IVOR (GLOVE) ×3 IMPLANT
GOWN SPEC L3 XXLG W/TWL (GOWN DISPOSABLE) ×3 IMPLANT
GOWN STRL REIN 3XL LVL4 (GOWN DISPOSABLE) ×3 IMPLANT
GOWN STRL REIN 3XL XLG LVL4 (GOWN DISPOSABLE) ×6 IMPLANT
GOWN STRL REUS W/TWL 2XL LVL3 (GOWN DISPOSABLE) ×3 IMPLANT
GOWN STRL REUS W/TWL LRG LVL3 (GOWN DISPOSABLE) ×6 IMPLANT
HANDPIECE INTERPULSE COAX TIP (DISPOSABLE) ×2
HOLDER FOLEY CATH W/STRAP (MISCELLANEOUS) ×3 IMPLANT
HOOD PEEL AWAY FLYTE STAYCOOL (MISCELLANEOUS) ×15 IMPLANT
IMMOBILIZER KNEE 20 (SOFTGOODS) ×3
IMMOBILIZER KNEE 20 THIGH 36 (SOFTGOODS) ×1 IMPLANT
INSERT TIBIA BEAR 7/75 10 KNEE (Knees) ×3 IMPLANT
MANIFOLD NEPTUNE II (INSTRUMENTS) ×3 IMPLANT
PACK ICE MAXI GEL EZY WRAP (MISCELLANEOUS) ×3 IMPLANT
PACK TOTAL KNEE CUSTOM (KITS) ×3 IMPLANT
PATELLA STD 34X8.5 (Orthopedic Implant) ×3 IMPLANT
PEG FEMORAL DISTAL (Knees) ×1 IMPLANT
PLATE KNEE TIBIAL 71MM FIXED (Plate) ×3 IMPLANT
PROTECTOR NERVE ULNAR (MISCELLANEOUS) ×3 IMPLANT
SET HNDPC FAN SPRY TIP SCT (DISPOSABLE) ×1 IMPLANT
SUT VIC AB 0 CT1 36 (SUTURE) ×6 IMPLANT
SUT VIC AB 2-0 CT1 27 (SUTURE) ×2
SUT VIC AB 2-0 CT1 TAPERPNT 27 (SUTURE) ×1 IMPLANT
SUT VIC AB 3-0 SH 8-18 (SUTURE) ×3 IMPLANT
TRAY FOLEY MTR SLVR 14FR STAT (SET/KITS/TRAYS/PACK) ×3 IMPLANT
WRAP KNEE MAXI GEL POST OP (GAUZE/BANDAGES/DRESSINGS) ×3 IMPLANT

## 2018-05-25 NOTE — H&P (Signed)
PREOPERATIVE H&P  Chief Complaint: right knee pain  HPI: Gina Coffey is a 68 y.o. female who presents for preoperative history and physical with a diagnosis of right knee oa. Symptoms are rated as moderate to severe, and have been worsening.  This is significantly impairing activities of daily living.  She has elected for surgical management.   She has failed injections, activity modification, anti-inflammatories, and assistive devices.  Preoperative X-rays demonstrate end stage degenerative changes with osteophyte formation, loss of joint space, subchondral sclerosis.  She had an excellent result on her other side.  Past Medical History:  Diagnosis Date  . Anemia    hx of  . Anxiety    pt. denies  . Arthritis   . Depression   . Dysrhythmia    atrial fibrillation  . Family history of adverse reaction to anesthesia    brother aspirated and died . Mother confused after anesthesia  . Hypercholesteremia 08/05/2017  . Hypertension   . PAF (paroxysmal atrial fibrillation) (HCC) 04/29/2015   Overview:  Brief, converted to [facility] while in [facility] ED, CBC and CMP were normal, CHADS2 vasc score= 3, was discharged on a beta blocker and asprin  . Palpitations 08/05/2017  . Pneumonia    in 2006  . Primary localized osteoarthritis of left knee 12/09/2016  . S/P knee replacement 12/09/2016   left  . Severe obesity (BMI >= 40) (HCC) 12/09/2016   Past Surgical History:  Procedure Laterality Date  . ABDOMINAL HYSTERECTOMY     1998  . APPENDECTOMY    . CARPAL TUNNEL RELEASE Right 2005  . DILATION AND CURETTAGE OF UTERUS  11/26/2016   1974x2  . TOTAL KNEE ARTHROPLASTY Left 12/09/2016   Procedure: TOTAL KNEE ARTHROPLASTY;  Surgeon: Teryl Lucy, MD;  Location: [facility] OR;  Service: Orthopedics;  Laterality: Left;   Social History   Socioeconomic History  . Marital status: Widowed    Spouse name: Not on file  . Number of children: Not on file  . Years of education: Not on file  . Highest  education level: Not on file  Occupational History  . Not on file  Social Needs  . Financial resource strain: Not on file  . Food insecurity:    Worry: Not on file    Inability: Not on file  . Transportation needs:    Medical: Not on file    Non-medical: Not on file  Tobacco Use  . Smoking status: Never Smoker  . Smokeless tobacco: Never Used  Substance and Sexual Activity  . Alcohol use: No  . Drug use: No  . Sexual activity: Not Currently  Lifestyle  . Physical activity:    Days per week: Not on file    Minutes per session: Not on file  . Stress: Not on file  Relationships  . Social connections:    Talks on phone: Not on file    Gets together: Not on file    Attends religious service: Not on file    Active member of club or organization: Not on file    Attends meetings of clubs or organizations: Not on file    Relationship status: Not on file  Other Topics Concern  . Not on file  Social History Narrative  . Not on file   Family History  Problem Relation Age of Onset  . Dementia Mother   . Heart disease Father    Allergies  Allergen Reactions  . Onion Itching, Swelling and Rash   Prior to Admission  medications   Medication Sig Start Date End Date Taking? Authorizing Provider  cetirizine (ZYRTEC) 10 MG tablet Take 10 mg by mouth daily.   Yes [provider]  cholecalciferol (VITAMIN D) 1000 units tablet Take 1,000 Units by mouth daily. 25 mcg   Yes [provider]  diltiazem (CARDIZEM CD) 120 MG 24 hr capsule Take 1 capsule (120 mg total) by mouth as needed. For heart rate greater than 120. 08/12/17 04/06/20 Yes Munley, Iline Oven, MD  ELIQUIS 5 MG TABS tablet Take 1 tablet (5 mg total) by mouth 2 (two) times daily. Patient needs appointment for future refills. 12/09/16  Yes Teryl Lucy, MD  flecainide (TAMBOCOR) 50 MG tablet Take 1 tablet (50 mg total) by mouth 2 (two) times daily. 02/19/18  Yes Baldo Daub, MD  lisinopril-hydrochlorothiazide  (PRINZIDE,ZESTORETIC) 20-12.5 MG tablet Take 1 tablet by mouth daily.   Yes [provider]  Omega-3 Fatty Acids (FISH OIL) 1000 MG CAPS Take 1,000 mg by mouth daily.   Yes [provider]  rosuvastatin (CRESTOR) 20 MG tablet Take 20 mg by mouth every evening.   Yes [provider]  sertraline (ZOLOFT) 50 MG tablet Take 50 mg by mouth daily.   Yes [provider]  traMADol (ULTRAM) 50 MG tablet Take 50 mg by mouth every 6 (six) hours as needed (for pain.).   Yes [provider]  vitamin E 400 UNIT capsule Take 400 Units by mouth daily.   Yes [provider]  EPINEPHrine (EPIPEN 2-PAK) 0.3 mg/0.3 mL IJ SOAJ injection Inject 0.3 mg into the muscle daily as needed (for anaphylactic allergic reactions).    [provider]     Positive ROS: All other systems have been reviewed and were otherwise negative with the exception of those mentioned in the HPI and as above.  Physical Exam: General: Alert, no acute distress Cardiovascular: No pedal edema Respiratory: No cyanosis, no use of accessory musculature GI: No organomegaly, abdomen is soft and non-tender Skin: No lesions in the area of chief complaint Neurologic: Sensation intact distally Psychiatric: Patient is competent for consent with normal mood and affect Lymphatic: No axillary or cervical lymphadenopathy  MUSCULOSKELETAL: right knee with 0-100 degrees of motion with painful arc with crepitance.  Assessment: Right knee oa Morbid obesity: Estimated body mass index is 48.59 kg/m as calculated from the following:   Height as of this encounter: 5\' 5"  (1.651 m).   Weight as of this encounter: 132.5 kg.    Plan: Plan for Procedure(s): TOTAL KNEE ARTHROPLASTY  The risks benefits and alternatives were discussed with the patient including but not limited to the risks of nonoperative treatment, versus surgical intervention including infection, bleeding, nerve injury,  blood  clots, cardiopulmonary complications, morbidity, mortality, among others, and they were willing to proceed.   Anticipated LOS equal to or greater than 2 midnights due to - Age 58 and older with one or more of the following:  - Obesity  - Expected need for hospital services (PT, OT, Nursing) required for safe  discharge  - Anticipated need for postoperative skilled nursing care or inpatient rehab  - Active co-morbidities: None OR   - Unanticipated findings during/Post Surgery: None  - Patient is a high risk of re-admission due to: None   Discussed at length risk of elevated BMI, but she is adamant to proceed.    Eulas Post, MD Cell [phone]   05/25/2018 7:21 AM

## 2018-05-25 NOTE — Anesthesia Postprocedure Evaluation (Signed)
Anesthesia Post Note  Patient: Gina Coffey  Procedure(s) Performed: TOTAL KNEE ARTHROPLASTY (Right Knee)     Patient location during evaluation: PACU Anesthesia Type: Spinal Level of consciousness: oriented and awake and alert Pain management: pain level controlled Vital Signs Assessment: post-procedure vital signs reviewed and stable Respiratory status: spontaneous breathing, respiratory function stable and patient connected to nasal cannula oxygen Cardiovascular status: blood pressure returned to baseline and stable Postop Assessment: no headache, no backache and no apparent nausea or vomiting Anesthetic complications: no    Last Vitals:  Vitals:   05/25/18 1314 05/25/18 1353  BP: 132/65 (!) 142/61  Pulse: 65 72  Resp: 16 15  Temp: (!) 36.3 C (!) 36.4 C  SpO2: 97% 99%    Last Pain:  Vitals:   05/25/18 1331  TempSrc:   PainSc: 5                  Mishika Flippen S

## 2018-05-25 NOTE — Anesthesia Procedure Notes (Signed)
Date/Time: 05/25/2018 7:32 AM Performed by: Florene Route, CRNA Oxygen Delivery Method: Simple face mask

## 2018-05-25 NOTE — Evaluation (Signed)
Physical Therapy Evaluation Patient Details Name: Gina Coffey MRN: [id number] DOB: [birth year] Today's Date: 05/25/2018   History of Present Illness  S/P left  TKA, H/O  right TKA, HTN,depression anxiety  Clinical Impression  The patient is able to perform a SLR. , Ambulated x 30'. Plans  DC to home with family assisting.  Pt admitted with above diagnosis. Pt currently with functional limitations due to the deficits listed below (see PT Problem List).  Pt will benefit from skilled PT to increase their independence and safety with mobility to allow discharge to the venue listed below.       Follow Up Recommendations Follow surgeon's recommendation for DC plan and follow-up therapies;Home health PT    Equipment Recommendations  None recommended by PT    Recommendations for Other Services       Precautions / Restrictions Precautions Precautions: Fall;Knee Required Braces or Orthoses: Knee Immobilizer - Right      Mobility  Bed Mobility Overal bed mobility: Needs Assistance Bed Mobility: Supine to Sit     Supine to sit: Min assist     General bed mobility comments: assist trunk and right leg  Transfers Overall transfer level: Needs assistance Equipment used: Rolling walker (2 wheeled) Transfers: Sit to/from Stand Sit to Stand: Min assist         General transfer comment: cues for hand and right leg position  Ambulation/Gait Ambulation/Gait assistance: Min assist Gait Distance (Feet): 30 Feet Assistive device: Rolling walker (2 wheeled) Gait Pattern/deviations: Step-to pattern;Decreased step length - right;Decreased stance time - right     General Gait Details: cues for sequence  Stairs            Wheelchair Mobility    Modified Rankin (Stroke Patients Only)       Balance                                             Pertinent Vitals/Pain Pain Assessment: 0-10 Pain Score: 2  Pain Location: right knee Pain  Descriptors / Indicators: Discomfort;Sore Pain Intervention(s): Premedicated before session;Monitored during session;Ice applied    Home Living Family/patient expects to be discharged to:: Private residence Living Arrangements: Alone;Children Available Help at Discharge: Family;Available 24 hours/day Type of Home: House Home Access: Stairs to enter Entrance Stairs-Rails: Doctor, general practice of Steps: 3 with a grab bar Home Layout: Two level;Able to live on main level with bedroom/bathroom Home Equipment: Dan Humphreys - 2 wheels;Walker - 4 wheels;Bedside commode;Cane - single point;Shower seat      Prior Function Level of Independence: Independent with assistive device(s)               Hand Dominance        Extremity/Trunk Assessment   Upper Extremity Assessment Upper Extremity Assessment: Overall WFL for tasks assessed    Lower Extremity Assessment Lower Extremity Assessment: RLE deficits/detail RLE Deficits / Details: + SLR, knee flexion to 30    Cervical / Trunk Assessment Cervical / Trunk Assessment: Normal  Communication   Communication: No difficulties  Cognition Arousal/Alertness: Awake/alert Behavior During Therapy: WFL for tasks assessed/performed Overall Cognitive Status: Within Functional Limits for tasks assessed                                        General  Comments      Exercises     Assessment/Plan    PT Assessment Patient needs continued PT services  PT Problem List Decreased strength;Decreased range of motion;Decreased activity tolerance;Decreased mobility;Decreased knowledge of precautions;Decreased safety awareness;Decreased knowledge of use of DME;Pain       PT Treatment Interventions DME instruction;Therapeutic exercise;Gait training;Stair training;Functional mobility training;Therapeutic activities;Patient/family education    PT Goals (Current goals can be found in the Care Plan section)  Acute Rehab PT  Goals Patient Stated Goal: go home tomorrow PT Goal Formulation: With patient Time For Goal Achievement: 05/29/18 Potential to Achieve Goals: Good    Frequency 7X/week   Barriers to discharge        Co-evaluation               AM-PAC PT "6 Clicks" Mobility  Outcome Measure Help needed turning from your back to your side while in a flat bed without using bedrails?: A Lot Help needed moving from lying on your back to sitting on the side of a flat bed without using bedrails?: A Lot Help needed moving to and from a bed to a chair (including a wheelchair)?: A Lot Help needed standing up from a chair using your arms (e.g., wheelchair or bedside chair)?: A Lot Help needed to walk in hospital room?: A Lot Help needed climbing 3-5 steps with a railing? : A Lot 6 Click Score: 12    End of Session   Activity Tolerance: Patient tolerated treatment well Patient left: in chair;with call bell/phone within reach Nurse Communication: Mobility status PT Visit Diagnosis: Unsteadiness on feet (R26.81);Pain Pain - Right/Left: Right Pain - part of body: Knee    Time: 1541-1606 PT Time Calculation (min) (ACUTE ONLY): 25 min   Charges:   PT Evaluation $PT Eval Low Complexity: 1 Low PT Treatments $Gait Training: 8-22 mins        Blanchard Kelch PT Acute Rehabilitation Services Pager [phone] Office [phone]   Rada Hay 05/25/2018, 4:48 PM

## 2018-05-25 NOTE — Transfer of Care (Signed)
Immediate Anesthesia Transfer of Care Note  Patient: Gina BellowsJanice Robbins Coffey  Procedure(s) Performed: TOTAL KNEE ARTHROPLASTY (Right Knee)  Patient Location: PACU  Anesthesia Type:Spinal  Level of Consciousness: awake  Airway & Oxygen Therapy: Patient Spontanous Breathing and Patient connected to face mask oxygen  Post-op Assessment: Report given to RN and Post -op Vital signs reviewed and stable  Post vital signs: Reviewed and stable  Last Vitals:  Vitals Value Taken Time  BP 137/64 05/25/2018 10:21 AM  Temp    Pulse 67 05/25/2018 10:23 AM  Resp 21 05/25/2018 10:23 AM  SpO2 98 % 05/25/2018 10:23 AM  Vitals shown include unvalidated device data.  Last Pain:  Vitals:   05/25/18 0600  TempSrc:   PainSc: 0-No pain      Patients Stated Pain Goal: 4 (05/25/18 0600)  Complications: No apparent anesthesia complications

## 2018-05-25 NOTE — Op Note (Signed)
DATE OF SURGERY:  05/25/2018 TIME: 9:47 AM  PATIENT NAME:  Gina Coffey   AGE: 68 y.o.    PRE-OPERATIVE DIAGNOSIS:  Right knee primary localized osteoarthritis  POST-OPERATIVE DIAGNOSIS:  Same  PROCEDURE:  Total Knee Arthroplasty  SURGEON:  Eulas Post, MD   ASSISTANT:  Janace Litten, OPA-C, present and scrubbed throughout the case, critical for assistance with exposure, retraction, instrumentation, and closure.  ANESTHESIA: Spinal with abductor canal block and intra-articular injection of Toradol and Marcaine.  OPERATIVE IMPLANTS: Biomet Vanguard Fixed Bearing Posterior Stabilized Femur size 70, Tibia size 71, Patella size 34 3-peg oval button, with a 10 mm polyethylene insert.   PREOPERATIVE INDICATIONS:  Gina Coffey is a 68 y.o. year old female with end stage bone on bone degenerative arthritis of the knee who failed conservative treatment, including injections, antiinflammatories, activity modification, and assistive devices, and had significant impairment of their activities of daily living, and elected for Total Knee Arthroplasty.  She also had coexisting morbid obesity with a BMI of 48.  She did excellent on the contralateral side.  I discussed the extreme high risk of arthroplasty with coexisting BMI greater than 40, and despite extended discussions she is adamant about proceeding with right total knee replacement given what an excellent result she achieved on the left side.  The risks, benefits, and alternatives were discussed at length including but not limited to the risks of infection, bleeding, nerve injury, stiffness, blood clots, the need for revision surgery, cardiopulmonary complications, among others, and they were willing to proceed.  OPERATIVE FINDINGS AND UNIQUE ASPECTS OF THE CASE: She had hyperextension preoperatively.  She had extensive grade 4 chondral loss medially.  The lateral side also had a fairly substantial.  In the lateral  femoral condyle of where it looked like it was probably engaging with the tibial spine.  The ACL was deficient.  Lateral tibial condyle had extensive grade 3 erosive changes.  The undersurface of the patella had grade 2 changes.  She had a fairly deficient anterior flange, and the size 70 did not notch, although there was not a lot of anterior flange to support, but I did not want to move the cut more posteriorly.  I used cement to augment the anterior support particularly on the medial side where there was not a large flange.  ESTIMATED BLOOD LOSS: 75 mL  OPERATIVE DESCRIPTION:  The patient was brought to the operative room and placed in a supine position.  Spinal anesthesia was administered.  IV antibiotics were given.  The lower extremity was prepped and draped in the usual sterile fashion.  Time out was performed.  The leg was elevated and exsanguinated and the tourniquet was inflated.  The tourniquet did in fact work on this leg, and total tourniquet time was approximately 85 minutes.  Anterior quadriceps tendon splitting approach was performed.  The patella was everted and osteophytes were removed.  The anterior horn of the medial and lateral meniscus was removed.   The distal femur was opened with the drill and the intramedullary distal femoral cutting jig was utilized, set at 5 degrees resecting 10 mm off the distal femur.  Care was taken to protect the collateral ligaments.  Then the extramedullary tibial cutting jig was utilized making the appropriate cut using the anterior tibial crest as a reference building in appropriate posterior slope.  Care was taken during the cut to protect the medial and collateral ligaments.  The proximal tibia was removed along with the posterior  horns of the menisci.  The PCL was sacrificed.    The extensor gap was measured and was approximately 10mm.    The distal femoral sizing jig was applied, taking care to avoid notching.  Then the 4-in-1 cutting jig was  applied and the anterior and posterior femur was cut, along with the chamfer cuts.  All posterior osteophytes were removed.  The flexion gap was then measured and was symmetric with the extension gap.  I completed the distal femoral preparation using the appropriate jig to prepare the box.  The patella was then measured, and cut with the saw.  The thickness before the cut was 21 and after the cut was 14.  The proximal tibia sized and prepared accordingly with the reamer and the punch, and then all components were trialed with the 10mm poly insert.  The knee was found to have excellent balance and full motion.    The above named components were then cemented into place and all excess cement was removed.  The real polyethylene implant was placed.  After the cement had cured I released the tourniquet and confirmed excellent hemostasis with no major posterior vessel injury.    The knee was easily taken through a range of motion and the patella tracked well and the knee irrigated copiously and the parapatellar and subcutaneous tissue closed with vicryl, and monocryl with steri strips for the skin.  The wounds were injected with marcaine, and dressed with sterile gauze and the patient was awakened and returned to the PACU in stable and satisfactory condition.  There were no complications.  Total tourniquet time was ~85 minutes.   I was notified by the operative room staff at the completion of the case that there was a possible contamination of 1 of the trays, which was handled in the routine procedure.

## 2018-05-25 NOTE — Anesthesia Procedure Notes (Signed)
Anesthesia Procedure Image    

## 2018-05-25 NOTE — Anesthesia Procedure Notes (Signed)
Spinal  Patient location during procedure: OR Start time: 05/25/2018 7:31 AM End time: 05/25/2018 7:39 AM Staffing Anesthesiologist: Myrtie Soman, MD Performed: anesthesiologist  Preanesthetic Checklist Completed: patient identified, site marked, surgical consent, pre-op evaluation, timeout performed, IV checked, risks and benefits discussed and monitors and equipment checked Spinal Block Patient position: sitting Prep: Betadine Patient monitoring: heart rate, continuous pulse ox and blood pressure Location: L4-5 Injection technique: single-shot Needle Needle type: Spinocan  Needle gauge: 22 G Needle length: 9 cm Additional Notes Expiration date of kit checked and confirmed. Patient tolerated procedure well, without complications.

## 2018-05-25 NOTE — Discharge Instructions (Signed)

## 2018-05-25 NOTE — Anesthesia Procedure Notes (Signed)
Anesthesia Regional Block: Adductor canal block   Pre-Anesthetic Checklist: ,, timeout performed, Correct Patient, Correct Site, Correct Laterality, Correct Procedure, Correct Position, site marked, Risks and benefits discussed,  Surgical consent,  Pre-op evaluation,  At surgeon's request and post-op pain management  Laterality: Right  Prep: chloraprep       Needles:  Injection technique: Single-shot  Needle Type: Stimiplex     Needle Length: 9cm      Additional Needles:   Procedures:,,,, ultrasound used (permanent image in chart),,,,  Narrative:  Start time: 05/25/2018 6:50 AM End time: 05/25/2018 6:57 AM Injection made incrementally with aspirations every 5 mL.  Performed by: Personally  Anesthesiologist: Eilene Ghazi, MD  Additional Notes: Patient tolerated the procedure well without complications

## 2018-05-25 NOTE — Anesthesia Procedure Notes (Signed)
Date/Time: 05/25/2018 6:54 AM Performed by: Florene Route, CRNA Oxygen Delivery Method: Nasal cannula

## 2018-05-26 ENCOUNTER — Encounter (HOSPITAL_COMMUNITY): Payer: Self-pay | Admitting: Orthopedic Surgery

## 2018-05-26 LAB — CBC
HCT: 37.4 % (ref 36.0–46.0)
Hemoglobin: 11.7 g/dL — ABNORMAL LOW (ref 12.0–15.0)
MCH: 29.5 pg (ref 26.0–34.0)
MCHC: 31.3 g/dL (ref 30.0–36.0)
MCV: 94.4 fL (ref 80.0–100.0)
Platelets: 207 10*3/uL (ref 150–400)
RBC: 3.96 MIL/uL (ref 3.87–5.11)
RDW: 13.1 % (ref 11.5–15.5)
WBC: 17.4 10*3/uL — ABNORMAL HIGH (ref 4.0–10.5)
nRBC: 0 % (ref 0.0–0.2)

## 2018-05-26 LAB — BASIC METABOLIC PANEL
Anion gap: 6 (ref 5–15)
BUN: 19 mg/dL (ref 8–23)
CO2: 24 mmol/L (ref 22–32)
CREATININE: 0.76 mg/dL (ref 0.44–1.00)
Calcium: 9 mg/dL (ref 8.9–10.3)
Chloride: 106 mmol/L (ref 98–111)
GFR calc non Af Amer: >60 mL/min (ref >60)
Glucose, Bld: 130 mg/dL — ABNORMAL HIGH (ref 70–99)
Potassium: 4.7 mmol/L (ref 3.5–5.1)
SODIUM: 136 mmol/L (ref 135–145)

## 2018-05-26 NOTE — Progress Notes (Addendum)
Physical Therapy Treatment Patient Details Name: Gina ShipperJanice Robbins Jeanbaptiste MRN: [id number] DOB: 1950/08/12 Today's Date: 05/26/2018    History of Present Illness S/P left  TKA, H/O  right TKA, HTN,depression anxiety    PT Comments    The patient is  Progressing well. Will practice steps, then will DC.   Follow Up Recommendations  Follow surgeon's recommendation for DC plan and follow-up therapies;Home health PT     Equipment Recommendations  None recommended by PT    Recommendations for Other Services       Precautions / Restrictions Precautions Precautions: Fall;Knee Precaution Comments: did not wear Required Braces or Orthoses: Knee Immobilizer - Right    Mobility  Bed Mobility               General bed mobility comments: oob  Transfers   Equipment used: Rolling walker (2 wheeled) Transfers: Sit to/from Stand Sit to Stand: Min assist         General transfer comment: cues for hand and right leg position  Ambulation/Gait Ambulation/Gait assistance: Min assist Gait Distance (Feet): 100 Feet Assistive device: Rolling walker (2 wheeled) Gait Pattern/deviations: Step-to pattern;Step-through pattern     General Gait Details: cues for sequence   Stairs             Wheelchair Mobility    Modified Rankin (Stroke Patients Only)       Balance                                            Cognition Arousal/Alertness: Awake/alert                                            Exercises Total Joint Exercises Ankle Circles/Pumps: AROM;Both;10 reps Quad Sets: AROM;Both;10 reps Short Arc Quad: AAROM;AROM;Right Heel Slides: AAROM;AROM;Right Hip ABduction/ADduction: AAROM;AROM;Right Straight Leg Raises: AAROM;AROM;Right Long Arc Quad: AAROM;AROM;Right Knee Flexion: AAROM;AROM;Right Goniometric ROM: 5-70 knee flex right    General Comments        Pertinent Vitals/Pain Pain Score: 1  Pain Location: right  knee Pain Descriptors / Indicators: Discomfort;Sore Pain Intervention(s): Monitored during session;Ice applied    Home Living                      Prior Function            PT Goals (current goals can now be found in the care plan section) Progress towards PT goals: Progressing toward goals    Frequency    7X/week      PT Plan Current plan remains appropriate    Co-evaluation              AM-PAC PT "6 Clicks" Mobility   Outcome Measure  Help needed turning from your back to your side while in a flat bed without using bedrails?: A Lot Help needed moving from lying on your back to sitting on the side of a flat bed without using bedrails?: A Lot Help needed moving to and from a bed to a chair (including a wheelchair)?: A Little Help needed standing up from a chair using your arms (e.g., wheelchair or bedside chair)?: A Little Help needed to walk in hospital room?: A Little Help needed climbing 3-5 steps with a railing? : A  Lot 6 Click Score: 15    End of Session   Activity Tolerance: Patient tolerated treatment well Patient left: in chair Nurse Communication: Mobility status PT Visit Diagnosis: Unsteadiness on feet (R26.81);Pain Pain - Right/Left: Right Pain - part of body: Knee     Time: 1021-1050 PT Time Calculation (min) (ACUTE ONLY): 29 min  Charges:  $Gait Training: 8-22 mins $Therapeutic Exercise: 8-22 mins                     Blanchard KelchKaren Nolene Rocks PT Acute Rehabilitation Services Pager [phone] Office [phone]    Rada HayHill, Trinitee Horgan Elizabeth 05/26/2018, 1:23 PM

## 2018-05-26 NOTE — Progress Notes (Signed)
Physical Therapy Treatment Patient Details Name: Gina Coffey MRN: [id number] DOB: December 04, 1950 Today's Date: 05/26/2018    History of Present Illness S/P left  TKA, H/O  right TKA, HTN,depression anxiety    PT Comments    Ready for DC.   Follow Up Recommendations  Follow surgeon's recommendation for DC plan and follow-up therapies;Home health PT     Equipment Recommendations  None recommended by PT    Recommendations for Other Services       Precautions / Restrictions Precautions Precautions: Fall;Knee Precaution Comments: KI for steps Required Braces or Orthoses: Knee Immobilizer - Right    Mobility  Bed Mobility               General bed mobility comments: oob  Transfers   Equipment used: Rolling walker (2 wheeled) Transfers: Sit to/from Stand Sit to Stand: Supervision         General transfer comment: cues for hand and right leg position  Ambulation/Gait Ambulation/Gait assistance: Min assist Gait Distance (Feet): 50 Feet Assistive device: Rolling walker (2 wheeled) Gait Pattern/deviations: Step-through pattern     General Gait Details: cues for sequence   Stairs Stairs: Yes Stairs assistance: Min guard Stair Management: One rail Left;Forwards;Two rails Number of Stairs: 3 General stair comments: simulated hand grip and rail   Wheelchair Mobility    Modified Rankin (Stroke Patients Only)       Balance                                            Cognition Arousal/Alertness: Awake/alert                                            Exercises   General Comments        Pertinent Vitals/Pain Pain Score: 3  Pain Location: right knee Pain Descriptors / Indicators: Discomfort;Sore Pain Intervention(s): Patient requesting pain meds-RN notified;Monitored during session    Home Living                      Prior Function            PT Goals (current goals can now be found in  the care plan section) Progress towards PT goals: Progressing toward goals    Frequency    7X/week      PT Plan Current plan remains appropriate    Co-evaluation              AM-PAC PT "6 Clicks" Mobility   Outcome Measure  Help needed turning from your back to your side while in a flat bed without using bedrails?: A Little Help needed moving from lying on your back to sitting on the side of a flat bed without using bedrails?: A Little Help needed moving to and from a bed to a chair (including a wheelchair)?: A Little Help needed standing up from a chair using your arms (e.g., wheelchair or bedside chair)?: A Little Help needed to walk in hospital room?: A Little Help needed climbing 3-5 steps with a railing? : A Little 6 Click Score: 18    End of Session   Activity Tolerance: Patient tolerated treatment well Patient left: in chair Nurse Communication: Mobility status PT Visit Diagnosis: Unsteadiness on feet (  R26.81);Pain Pain - Right/Left: Right Pain - part of body: Knee     Time: 5625-6389 PT Time Calculation (min) (ACUTE ONLY): 10 min  Charges:  $Gait Training: 8-22 mins $Therapeutic Exercise: 8-22 mins                     Blanchard Kelch PT Acute Rehabilitation Services Pager [phone] Office [phone]    Rada Hay 05/26/2018, 1:28 PM

## 2018-05-26 NOTE — Care Management Note (Signed)
Case Management Note  Patient Details  Name: Gina ShipperJanice Robbins Coffey MRN: [id number] Date of Birth: [birth year]  Subjective/Objective:  Discharge planning, spoke with patient at bedside. [facility].          Action/Plan: Contacted [facility] for referral. They have accepted. Has DME. [phone]           Expected Discharge Date:  05/26/18               Expected Discharge Plan:  Home w Home Health Services  In-House Referral:  NA  Discharge planning Services  CM Consult  Post Acute Care Choice:  Home Health Choice offered to:  Patient  DME Arranged:  N/A DME Agency:  NA  HH Arranged:  PT HH Agency:  [facility] (formerly [facility])  Status of Service:  Completed, signed off  If discussed at [employer] Meetings, dates discussed:    Additional Comments:  Alexis Goodelleele, Hernandez Losasso K, RN 05/26/2018, 9:35 AM

## 2018-05-26 NOTE — Discharge Summary (Signed)
Physician Discharge Summary  Patient ID: Gina Coffey MRN: [id number] DOB/AGE: 09/08/1950 68 y.o.  Admit date: 05/25/2018 Discharge date: 05/26/2018  Admission Diagnoses:  Primary localized osteoarthritis of right knee  Discharge Diagnoses:  Principal Problem:   Primary localized osteoarthritis of right knee   Past Medical History:  Diagnosis Date  . Anemia    hx of  . Anxiety    pt. denies  . Arthritis   . Depression   . Dysrhythmia    atrial fibrillation  . Family history of adverse reaction to anesthesia    brother aspirated and died . Mother confused after anesthesia  . Hypercholesteremia 08/05/2017  . Hypertension   . PAF (paroxysmal atrial fibrillation) (HCC) 04/29/2015   Overview:  [facility], CBC and CMP were normal, CHADS2 vasc score= 3, was discharged on a beta blocker and asprin  . Palpitations 08/05/2017  . Pneumonia    in 2006  . Primary localized osteoarthritis of left knee 12/09/2016  . Primary localized osteoarthritis of right knee 05/25/2018  . S/P knee replacement 12/09/2016   left  . Severe obesity (BMI >= 40) (HCC) 12/09/2016    Surgeries: Procedure(s): TOTAL KNEE ARTHROPLASTY on 05/25/2018   Consultants (if any):   Discharged Condition: Improved  Hospital Course: Gina ShipperJanice Robbins Tobia is an 68 y.o. female who was admitted 05/25/2018 with a diagnosis of Primary localized osteoarthritis of right knee and went to the operating room on 05/25/2018 and underwent the above named procedures.    She was given perioperative antibiotics:  Anti-infectives (From admission, onward)   Start     Dose/Rate Route Frequency Ordered Stop   05/25/18 1400  ceFAZolin (ANCEF) IVPB 2g/100 mL premix     2 g 200 mL/hr over 30 Minutes Intravenous Every 6 hours 05/25/18 1223 05/25/18 2002   05/25/18 0600  ceFAZolin (ANCEF) 3 g in dextrose 5 % 50 mL IVPB     3 g 100 mL/hr over 30 Minutes Intravenous On call to O.R. 05/24/18 [id number] 05/25/18 0743     .  She was given sequential compression devices, early ambulation, and eliquis for DVT prophylaxis.  She benefited maximally from the hospital stay and there were no complications.    Recent vital signs:  Vitals:   05/26/18 0250 05/26/18 0642  BP: 126/63 138/61  Pulse: (!) 57 (!) 56  Resp: 20 20  Temp: 98.1 F (36.7 C) 97.9 F (36.6 C)  SpO2: 98% 98%    Recent laboratory studies:  Lab Results  Component Value Date   HGB 11.7 (L) 05/26/2018   HGB 14.3 05/18/2018   HGB 9.5 (L) 12/11/2016   Lab Results  Component Value Date   WBC 17.4 (H) 05/26/2018   PLT 207 05/26/2018   Lab Results  Component Value Date   INR 1.04 12/09/2016   Lab Results  Component Value Date   NA 136 05/26/2018   K 4.7 05/26/2018   CL 106 05/26/2018   CO2 24 05/26/2018   BUN 19 05/26/2018   CREATININE 0.76 05/26/2018   GLUCOSE 130 (H) 05/26/2018    Discharge Medications:   Allergies as of 05/26/2018      Reactions   Onion Itching, Swelling, Rash      Medication List    TAKE these medications   baclofen 10 MG tablet Commonly known as:  LIORESAL Take 1 tablet (10 mg total) by mouth 3 (three) times daily. As needed for muscle spasm   cetirizine 10 MG tablet  Commonly known as:  ZYRTEC Take 10 mg by mouth daily.   cholecalciferol 1000 units tablet Commonly known as:  VITAMIN D Take 1,000 Units by mouth daily. 25 mcg   diltiazem 120 MG 24 hr capsule Commonly known as:  CARDIZEM CD Take 1 capsule (120 mg total) by mouth as needed. For heart rate greater than 120.   ELIQUIS 5 MG Tabs tablet Generic drug:  apixaban Take 1 tablet (5 mg total) by mouth 2 (two) times daily. Patient needs appointment for future refills.   EPIPEN 2-PAK 0.3 mg/0.3 mL Soaj injection Generic drug:  EPINEPHrine Inject 0.3 mg into the muscle daily as needed (for anaphylactic allergic reactions).   Fish Oil 1000 MG Caps Take 1,000 mg by mouth daily.   flecainide 50 MG tablet Commonly known as:   TAMBOCOR Take 1 tablet (50 mg total) by mouth 2 (two) times daily.   lisinopril-hydrochlorothiazide 20-12.5 MG tablet Commonly known as:  PRINZIDE,ZESTORETIC Take 1 tablet by mouth daily.   oxyCODONE 5 MG immediate release tablet Commonly known as:  ROXICODONE Take 1 tablet (5 mg total) by mouth every 4 (four) hours as needed for severe pain.   rosuvastatin 20 MG tablet Commonly known as:  CRESTOR Take 20 mg by mouth every evening.   sennosides-docusate sodium 8.6-50 MG tablet Commonly known as:  SENOKOT-S Take 2 tablets by mouth daily.   sertraline 50 MG tablet Commonly known as:  ZOLOFT Take 50 mg by mouth daily.   traMADol 50 MG tablet Commonly known as:  ULTRAM Take 50 mg by mouth every 6 (six) hours as needed (for pain.).   vitamin E 400 UNIT capsule Take 400 Units by mouth daily.       Diagnostic Studies: Dg Knee Right Port  Result Date: 05/25/2018 CLINICAL DATA:  Total knee arthroplasty EXAM: PORTABLE RIGHT KNEE - 1-2 VIEW COMPARISON:  None. FINDINGS: New total knee arthroplasty. The prosthesis is well seated. No periprosthetic fracture. Expected soft tissue gas and swelling. IMPRESSION: No unexpected finding after total knee arthroplasty. Electronically Signed   By: Marnee Spring M.D.   On: 05/25/2018 10:57   Mm 3d Screen Breast Bilateral  Result Date: 05/20/2018 CLINICAL DATA:  Screening. EXAM: DIGITAL SCREENING BILATERAL MAMMOGRAM WITH TOMO AND CAD COMPARISON:  Previous exam(s). ACR Breast Density Category c: The breast tissue is heterogeneously dense, which may obscure small masses. FINDINGS: There are no findings suspicious for malignancy. Images were processed with CAD. IMPRESSION: No mammographic evidence of malignancy. A result letter of this screening mammogram will be mailed directly to the patient. RECOMMENDATION: Screening mammogram in one year. (Code:[id number]) BI-RADS CATEGORY  1: Negative. Electronically Signed   By: Bary Richard M.D.   On: 05/20/2018  14:54    Disposition: Discharge disposition: 01-Home or Self Care         Follow-up Information    Teryl Lucy, MD. Schedule an appointment as soon as possible for a visit in 2 weeks.   Specialty:  Orthopedic Surgery Contact information: [address] [place] [address] [phone]            Signed: Eulas Post 05/26/2018, 9:14 AM

## 2018-05-28 DIAGNOSIS — I119 Hypertensive heart disease without heart failure: Secondary | ICD-10-CM | POA: Diagnosis not present

## 2018-05-28 DIAGNOSIS — F419 Anxiety disorder, unspecified: Secondary | ICD-10-CM | POA: Diagnosis not present

## 2018-05-28 DIAGNOSIS — E785 Hyperlipidemia, unspecified: Secondary | ICD-10-CM | POA: Diagnosis not present

## 2018-05-28 DIAGNOSIS — D649 Anemia, unspecified: Secondary | ICD-10-CM | POA: Diagnosis not present

## 2018-05-28 DIAGNOSIS — Z6841 Body Mass Index (BMI) 40.0 and over, adult: Secondary | ICD-10-CM | POA: Diagnosis not present

## 2018-05-28 DIAGNOSIS — M542 Cervicalgia: Secondary | ICD-10-CM | POA: Diagnosis not present

## 2018-05-28 DIAGNOSIS — Z471 Aftercare following joint replacement surgery: Secondary | ICD-10-CM | POA: Diagnosis not present

## 2018-05-28 DIAGNOSIS — G8929 Other chronic pain: Secondary | ICD-10-CM | POA: Diagnosis not present

## 2018-05-28 DIAGNOSIS — F329 Major depressive disorder, single episode, unspecified: Secondary | ICD-10-CM | POA: Diagnosis not present

## 2018-05-28 DIAGNOSIS — Z7901 Long term (current) use of anticoagulants: Secondary | ICD-10-CM | POA: Diagnosis not present

## 2018-05-28 DIAGNOSIS — Z96653 Presence of artificial knee joint, bilateral: Secondary | ICD-10-CM | POA: Diagnosis not present

## 2018-05-28 DIAGNOSIS — I480 Paroxysmal atrial fibrillation: Secondary | ICD-10-CM | POA: Diagnosis not present

## 2018-06-01 DIAGNOSIS — Z471 Aftercare following joint replacement surgery: Secondary | ICD-10-CM | POA: Diagnosis not present

## 2018-06-01 DIAGNOSIS — E785 Hyperlipidemia, unspecified: Secondary | ICD-10-CM | POA: Diagnosis not present

## 2018-06-01 DIAGNOSIS — F329 Major depressive disorder, single episode, unspecified: Secondary | ICD-10-CM | POA: Diagnosis not present

## 2018-06-01 DIAGNOSIS — Z7901 Long term (current) use of anticoagulants: Secondary | ICD-10-CM | POA: Diagnosis not present

## 2018-06-01 DIAGNOSIS — D649 Anemia, unspecified: Secondary | ICD-10-CM | POA: Diagnosis not present

## 2018-06-01 DIAGNOSIS — I480 Paroxysmal atrial fibrillation: Secondary | ICD-10-CM | POA: Diagnosis not present

## 2018-06-01 DIAGNOSIS — Z96653 Presence of artificial knee joint, bilateral: Secondary | ICD-10-CM | POA: Diagnosis not present

## 2018-06-01 DIAGNOSIS — F419 Anxiety disorder, unspecified: Secondary | ICD-10-CM | POA: Diagnosis not present

## 2018-06-01 DIAGNOSIS — Z6841 Body Mass Index (BMI) 40.0 and over, adult: Secondary | ICD-10-CM | POA: Diagnosis not present

## 2018-06-01 DIAGNOSIS — I119 Hypertensive heart disease without heart failure: Secondary | ICD-10-CM | POA: Diagnosis not present

## 2018-06-01 DIAGNOSIS — M542 Cervicalgia: Secondary | ICD-10-CM | POA: Diagnosis not present

## 2018-06-01 DIAGNOSIS — G8929 Other chronic pain: Secondary | ICD-10-CM | POA: Diagnosis not present

## 2018-06-07 DIAGNOSIS — M1711 Unilateral primary osteoarthritis, right knee: Secondary | ICD-10-CM | POA: Diagnosis not present

## 2018-06-08 DIAGNOSIS — D649 Anemia, unspecified: Secondary | ICD-10-CM | POA: Diagnosis not present

## 2018-06-08 DIAGNOSIS — Z7901 Long term (current) use of anticoagulants: Secondary | ICD-10-CM | POA: Diagnosis not present

## 2018-06-08 DIAGNOSIS — M542 Cervicalgia: Secondary | ICD-10-CM | POA: Diagnosis not present

## 2018-06-08 DIAGNOSIS — G8929 Other chronic pain: Secondary | ICD-10-CM | POA: Diagnosis not present

## 2018-06-08 DIAGNOSIS — I480 Paroxysmal atrial fibrillation: Secondary | ICD-10-CM | POA: Diagnosis not present

## 2018-06-08 DIAGNOSIS — F329 Major depressive disorder, single episode, unspecified: Secondary | ICD-10-CM | POA: Diagnosis not present

## 2018-06-08 DIAGNOSIS — Z471 Aftercare following joint replacement surgery: Secondary | ICD-10-CM | POA: Diagnosis not present

## 2018-06-08 DIAGNOSIS — I119 Hypertensive heart disease without heart failure: Secondary | ICD-10-CM | POA: Diagnosis not present

## 2018-06-08 DIAGNOSIS — E785 Hyperlipidemia, unspecified: Secondary | ICD-10-CM | POA: Diagnosis not present

## 2018-06-08 DIAGNOSIS — F419 Anxiety disorder, unspecified: Secondary | ICD-10-CM | POA: Diagnosis not present

## 2018-06-08 DIAGNOSIS — Z96653 Presence of artificial knee joint, bilateral: Secondary | ICD-10-CM | POA: Diagnosis not present

## 2018-06-08 DIAGNOSIS — Z6841 Body Mass Index (BMI) 40.0 and over, adult: Secondary | ICD-10-CM | POA: Diagnosis not present

## 2018-06-10 DIAGNOSIS — E785 Hyperlipidemia, unspecified: Secondary | ICD-10-CM | POA: Diagnosis not present

## 2018-06-10 DIAGNOSIS — G8929 Other chronic pain: Secondary | ICD-10-CM | POA: Diagnosis not present

## 2018-06-10 DIAGNOSIS — F419 Anxiety disorder, unspecified: Secondary | ICD-10-CM | POA: Diagnosis not present

## 2018-06-10 DIAGNOSIS — M542 Cervicalgia: Secondary | ICD-10-CM | POA: Diagnosis not present

## 2018-06-10 DIAGNOSIS — Z6841 Body Mass Index (BMI) 40.0 and over, adult: Secondary | ICD-10-CM | POA: Diagnosis not present

## 2018-06-10 DIAGNOSIS — Z7901 Long term (current) use of anticoagulants: Secondary | ICD-10-CM | POA: Diagnosis not present

## 2018-06-10 DIAGNOSIS — I119 Hypertensive heart disease without heart failure: Secondary | ICD-10-CM | POA: Diagnosis not present

## 2018-06-10 DIAGNOSIS — D649 Anemia, unspecified: Secondary | ICD-10-CM | POA: Diagnosis not present

## 2018-06-10 DIAGNOSIS — Z96653 Presence of artificial knee joint, bilateral: Secondary | ICD-10-CM | POA: Diagnosis not present

## 2018-06-10 DIAGNOSIS — F329 Major depressive disorder, single episode, unspecified: Secondary | ICD-10-CM | POA: Diagnosis not present

## 2018-06-10 DIAGNOSIS — I480 Paroxysmal atrial fibrillation: Secondary | ICD-10-CM | POA: Diagnosis not present

## 2018-06-10 DIAGNOSIS — Z471 Aftercare following joint replacement surgery: Secondary | ICD-10-CM | POA: Diagnosis not present

## 2018-06-22 ENCOUNTER — Other Ambulatory Visit: Payer: Self-pay | Admitting: Cardiology

## 2018-06-29 DIAGNOSIS — Z79899 Other long term (current) drug therapy: Secondary | ICD-10-CM | POA: Diagnosis not present

## 2018-06-29 DIAGNOSIS — E039 Hypothyroidism, unspecified: Secondary | ICD-10-CM | POA: Diagnosis not present

## 2018-06-29 DIAGNOSIS — Z6841 Body Mass Index (BMI) 40.0 and over, adult: Secondary | ICD-10-CM | POA: Diagnosis not present

## 2018-06-29 DIAGNOSIS — E785 Hyperlipidemia, unspecified: Secondary | ICD-10-CM | POA: Diagnosis not present

## 2018-06-29 DIAGNOSIS — I480 Paroxysmal atrial fibrillation: Secondary | ICD-10-CM | POA: Diagnosis not present

## 2018-06-29 DIAGNOSIS — I10 Essential (primary) hypertension: Secondary | ICD-10-CM | POA: Diagnosis not present

## 2018-06-29 DIAGNOSIS — F419 Anxiety disorder, unspecified: Secondary | ICD-10-CM | POA: Diagnosis not present

## 2018-07-01 DIAGNOSIS — J0100 Acute maxillary sinusitis, unspecified: Secondary | ICD-10-CM | POA: Diagnosis not present

## 2018-07-01 DIAGNOSIS — R05 Cough: Secondary | ICD-10-CM | POA: Diagnosis not present

## 2018-07-05 DIAGNOSIS — M1711 Unilateral primary osteoarthritis, right knee: Secondary | ICD-10-CM | POA: Diagnosis not present

## 2018-08-16 ENCOUNTER — Telehealth: Payer: Self-pay | Admitting: Cardiology

## 2018-08-16 NOTE — Telephone Encounter (Signed)
Virtual Visit Pre-Appointment Phone Call  "(Name), I am calling you today to discuss your upcoming appointment. We are currently trying to limit exposure to the virus that causes COVID-19 by seeing patients at home rather than in the office."  1. "What is the BEST phone number to call the day of the visit?" - include this in appointment notes  2. Do you have or have access to (through a family member/friend) a smartphone with video capability that we can use for your visit?" a. If yes - list this number in appt notes as cell (if different from BEST phone #) and list the appointment type as a VIDEO visit in appointment notes b. If no - list the appointment type as a PHONE visit in appointment notes  3. Confirm consent - "In the setting of the current Covid19 crisis, you are scheduled for a (phone or video) visit with your provider on (date) at (time).  Just as we do with many in-office visits, in order for you to participate in this visit, we must obtain consent.  If you'd like, I can send this to your mychart (if signed up) or email for you to review.  Otherwise, I can obtain your verbal consent now.  All virtual visits are billed to your insurance company just like a normal visit would be.  By agreeing to a virtual visit, we'd like you to understand that the technology does not allow for your provider to perform an examination, and thus may limit your provider's ability to fully assess your condition. If your provider identifies any concerns that need to be evaluated in person, we will make arrangements to do so.  Finally, though the technology is pretty good, we cannot assure that it will always work on either your or our end, and in the setting of a video visit, we may have to convert it to a phone-only visit.  In either situation, we cannot ensure that we have a secure connection.  Are you willing to proceed?" STAFF: Did the patient verbally acknowledge consent to telehealth visit? Document  YES/NO here: Yes  4. Advise patient to be prepared - "Two hours prior to your appointment, go ahead and check your blood pressure, pulse, oxygen saturation, and your weight (if you have the equipment to check those) and write them all down. When your visit starts, your provider will ask you for this information. If you have an Apple Watch or Kardia device, please plan to have heart rate information ready on the day of your appointment. Please have a pen and paper handy nearby the day of the visit as well."  5. Give patient instructions for MyChart download to smartphone OR Doximity/[email or web address] as below if video visit (depending on what platform provider is using)  6. Inform patient they will receive a phone call 15 minutes prior to their appointment time (may be from unknown caller ID) so they should be prepared to answer    TELEPHONE CALL NOTE  Gina Coffey has been deemed a candidate for a follow-up tele-health visit to limit community exposure during the Covid-19 pandemic. I spoke with the patient via phone to ensure availability of phone/video source, confirm preferred email & phone number, and discuss instructions and expectations.  I reminded Gina Coffey Gina Coffey to be prepared with any vital sign and/or heart rhythm information that could potentially be obtained via home monitoring, at the time of her visit. I reminded Gina Coffey Gina Coffey to expect a phone call prior to  her visit.  [name] 08/16/2018 4:00 PM     FULL LENGTH CONSENT FOR TELE-HEALTH VISIT   I hereby voluntarily request, consent and authorize [facility] and its employed or contracted physicians, physician assistants, nurse practitioners or other licensed health care professionals (the Practitioner), to provide me with telemedicine health care services (the Services") as deemed necessary by the treating Practitioner. I acknowledge and consent to receive the Services by the Practitioner via  telemedicine. I understand that the telemedicine visit will involve communicating with the Practitioner through live audiovisual communication technology and the disclosure of certain medical information by electronic transmission. I acknowledge that I have been given the opportunity to request an in-person assessment or other available alternative prior to the telemedicine visit and am voluntarily participating in the telemedicine visit.  I understand that I have the right to withhold or withdraw my consent to the use of telemedicine in the course of my care at any time, without affecting my right to future care or treatment, and that the Practitioner or I may terminate the telemedicine visit at any time. I understand that I have the right to inspect all information obtained and/or recorded in the course of the telemedicine visit and may receive copies of available information for a reasonable fee.  I understand that some of the potential risks of receiving the Services via telemedicine include:   Delay or interruption in medical evaluation due to technological equipment failure or disruption;  Information transmitted may not be sufficient (e.g. poor resolution of images) to allow for appropriate medical decision making by the Practitioner; and/or   In rare instances, security protocols could fail, causing a breach of personal health information.  Furthermore, I acknowledge that it is my responsibility to provide information about my medical history, conditions and care that is complete and accurate to the best of my ability. I acknowledge that Practitioner's advice, recommendations, and/or decision may be based on factors not within their control, such as incomplete or inaccurate data provided by me or distortions of diagnostic images or specimens that may result from electronic transmissions. I understand that the practice of medicine is not an exact science and that Practitioner makes no warranties or  guarantees regarding treatment outcomes. I acknowledge that I will receive a copy of this consent concurrently upon execution via email to the email address I last provided but may also request a printed copy by calling the office of [facility].    I understand that my insurance will be billed for this visit.   I have read or had this consent read to me.  I understand the contents of this consent, which adequately explains the benefits and risks of the Services being provided via telemedicine.   I have been provided ample opportunity to ask questions regarding this consent and the Services and have had my questions answered to my satisfaction.  I give my informed consent for the services to be provided through the use of telemedicine in my medical care  By participating in this telemedicine visit I agree to the above.

## 2018-08-17 ENCOUNTER — Other Ambulatory Visit: Payer: Self-pay

## 2018-08-17 ENCOUNTER — Telehealth (INDEPENDENT_AMBULATORY_CARE_PROVIDER_SITE_OTHER): Payer: PPO | Admitting: Cardiology

## 2018-08-17 ENCOUNTER — Encounter: Payer: Self-pay | Admitting: Cardiology

## 2018-08-17 VITALS — BP 140/76 | Ht 65.0 in | Wt 294.0 lb

## 2018-08-17 DIAGNOSIS — I119 Hypertensive heart disease without heart failure: Secondary | ICD-10-CM

## 2018-08-17 DIAGNOSIS — Z7901 Long term (current) use of anticoagulants: Secondary | ICD-10-CM

## 2018-08-17 DIAGNOSIS — I480 Paroxysmal atrial fibrillation: Secondary | ICD-10-CM | POA: Diagnosis not present

## 2018-08-17 DIAGNOSIS — Z79899 Other long term (current) drug therapy: Secondary | ICD-10-CM

## 2018-08-17 NOTE — Patient Instructions (Addendum)
Medication Instructions:  Your physician recommends that you continue on your current medications as directed. Please refer to the Current Medication list given to you today.  If you need a refill on your cardiac medications before your next appointment, please call your pharmacy.   Lab work: None  If you have labs (blood work) drawn today and your tests are completely normal, you will receive your results only by: Marland Kitchen MyChart Message (if you have MyChart) OR . A paper copy in the mail If you have any lab test that is abnormal or we need to change your treatment, we will call you to review the results.  Testing/Procedures: None  Follow-Up: At [facility], you and your health needs are our priority.  As part of our continuing mission to provide you with exceptional heart care, we have created designated Provider Care Teams.  These Care Teams include your primary Cardiologist (physician) and Advanced Practice Providers (APPs -  Physician Assistants and Nurse Practitioners) who all work together to provide you with the care you need, when you need it. You will need a follow up virtual appointment in 3 months: Tuesday, 11/16/2018, at 9:00 am.

## 2018-08-17 NOTE — Progress Notes (Signed)
Virtual Visit via Video Note   This visit type was conducted due to national recommendations for restrictions regarding the COVID-19 Pandemic (e.g. social distancing) in an effort to limit this patient's exposure and mitigate transmission in our community.  Due to her co-morbid illnesses, this patient is at least at moderate risk for complications without adequate follow up.  This format is felt to be most appropriate for this patient at this time.  All issues noted in this document were discussed and addressed.  A limited physical exam was performed with this format.  Please refer to the patient's chart for her consent to telehealth for [facility].   Evaluation Performed:  Follow-up visit  Date:  08/17/2018   ID:  Gina Coffey, Gina Coffey 1951/01/14, MRN [id number]  Patient Location: Home Provider Location: Home  PCP:  Philemon Kingdom, MD  Cardiologist:  No primary care provider on file. Dr Dulce Sellar Electrophysiologist:  None   Chief Complaint:  FU for PAF now on flecanide  History of Present Illness:    Gina Coffey is a 68 y.o. female with a hx of  paroxysmal atrial fibrillation, anticoagulation and hypertension   last seen 03/21/28 after being placed on flecanide for recurrent PAF. She had a right TKA on 05/25/18.  She has had a marked improvement of the quality of her life after total knee arthroplasty worked in the garden all day today and has walked up to a mile at a time with grandchildren without limitation.  These are activities that were unthinkable to her prior to knee replacement.  On flecainide she has had no palpitation or recurrent atrial fibrillation tolerates her medications well and no bleeding complication of her anticoagulant.  She has good healthcare literacy wears a mask outdoors and practices good handwashing and sanitation technique.  The patient does not have symptoms concerning for COVID-19 infection (fever, chills, cough, or new shortness  of breath).    Past Medical History:  Diagnosis Date  . Anemia    hx of  . Anxiety    pt. denies  . Arthritis   . Depression   . Dysrhythmia    atrial fibrillation  . Family history of adverse reaction to anesthesia    brother aspirated and died . Mother confused after anesthesia  . Hypercholesteremia 08/05/2017  . Hypertension   . PAF (paroxysmal atrial fibrillation) (HCC) 04/29/2015   Overview:  Brief, converted to [facility] while in [facility] ED, CBC and CMP were normal, CHADS2 vasc score= 3, was discharged on a beta blocker and asprin  . Palpitations 08/05/2017  . Pneumonia    in 2006  . Primary localized osteoarthritis of left knee 12/09/2016  . Primary localized osteoarthritis of right knee 05/25/2018  . S/P knee replacement 12/09/2016   left  . Severe obesity (BMI >= 40) (HCC) 12/09/2016   Past Surgical History:  Procedure Laterality Date  . ABDOMINAL HYSTERECTOMY     1998  . APPENDECTOMY    . CARPAL TUNNEL RELEASE Right 2005  . DILATION AND CURETTAGE OF UTERUS  11/26/2016   1974x2  . TOTAL KNEE ARTHROPLASTY Left 12/09/2016   Procedure: TOTAL KNEE ARTHROPLASTY;  Surgeon: Teryl Lucy, MD;  Location: [facility] OR;  Service: Orthopedics;  Laterality: Left;  . TOTAL KNEE ARTHROPLASTY Right 05/25/2018   Procedure: TOTAL KNEE ARTHROPLASTY;  Surgeon: Teryl Lucy, MD;  Location: [facility] ORS;  Service: Orthopedics;  Laterality: Right;  Adductor Block     Current Meds  Medication Sig  . cetirizine (ZYRTEC) 10 MG  tablet Take 10 mg by mouth daily.  . cholecalciferol (VITAMIN D) 1000 units tablet Take 1,000 Units by mouth daily. 25 mcg  . diltiazem (CARDIZEM CD) 120 MG 24 hr capsule Take 1 capsule (120 mg total) by mouth as needed. For heart rate greater than 120.  Marland Kitchen. ELIQUIS 5 MG TABS tablet Take 1 tablet (5 mg total) by mouth 2 (two) times daily. Patient needs appointment for future refills.  Marland Kitchen. EPINEPHrine (EPIPEN 2-PAK) 0.3 mg/0.3 mL IJ SOAJ injection Inject 0.3 mg into the muscle daily as needed (for  anaphylactic allergic reactions).  . flecainide (TAMBOCOR) 50 MG tablet TAKE 1 TABLET BY MOUTH 2 TIMES DAILY  . lisinopril-hydrochlorothiazide (PRINZIDE,ZESTORETIC) 20-12.5 MG tablet Take 1 tablet by mouth daily.  . Omega-3 Fatty Acids (FISH OIL) 1000 MG CAPS Take 1,000 mg by mouth daily.  Marland Kitchen. oxyCODONE (ROXICODONE) 5 MG immediate release tablet Take 1 tablet (5 mg total) by mouth every 4 (four) hours as needed for severe pain.  . rosuvastatin (CRESTOR) 20 MG tablet Take 20 mg by mouth every evening.  . sertraline (ZOLOFT) 50 MG tablet Take 50 mg by mouth daily.  . traMADol (ULTRAM) 50 MG tablet Take 50 mg by mouth every 6 (six) hours as needed (for pain.).  Marland Kitchen. vitamin E 400 UNIT capsule Take 400 Units by mouth daily.     Allergies:   Onion   Social History   Tobacco Use  . Smoking status: Never Smoker  . Smokeless tobacco: Never Used  Substance Use Topics  . Alcohol use: No  . Drug use: No     Family Hx: The patient's family history includes Dementia in her mother; Heart disease in her father.  ROS:   Please see the history of present illness.     All other systems reviewed and are negative.   Prior CV studies:   The following studies were reviewed today:    Labs/Other Tests and Data Reviewed:    EKG:  No ECG reviewed.  Recent Labs:  0310/20 CMP is normal , Cr 0.9 GFR 66 cc/min Hgb 13.5                Chol 131 HDL 31 LDL 53  05/26/2018: BUN 19; Creatinine, Ser 0.76; Hemoglobin 11.7; Platelets 207; Potassium 4.7; Sodium 136     Wt Readings from Last 3 Encounters:  08/17/18 294 lb (133.4 kg)  05/25/18 292 lb (132.5 kg)  05/18/18 292 lb (132.5 kg)     Objective:    Vital Signs:  BP 140/76 (BP Location: Right Arm, Patient Position: Sitting)   Ht 5\' 5"  (1.651 m)   Wt 294 lb (133.4 kg)   BMI 48.92 kg/m    VITAL SIGNS:  reviewed GEN:  no acute distress EYES:  sclerae anicteric, EOMI - Extraocular Movements Intact RESPIRATORY:  normal respiratory effort,  symmetric expansion CARDIOVASCULAR:  no peripheral edema SKIN:  no rash, lesions or ulcers. MUSCULOSKELETAL:  no obvious deformities. NEURO:  alert and oriented x 3, no obvious focal deficit PSYCH:  normal affect Although she has had no change in weight she has remarkable change in her demeanor or manner she no longer looks chronically ill and is much more vigorous and enthusiastic ASSESSMENT & PLAN:    1. Atrial fibrillation, paroxysmal stable she has had a very nice response to antiarrhythmic therapy with flecainide no clinical recurrence tolerates the medication no toxicity and remains anti-coagulated.  She will need an EKG next office visit.  Certainly was nice that  she had lab work done at her PCP office with normal liver function. 2. High risk medication flecainide stable check in office EKG next visit, she takes the minimal effective dose I do not think she will require her blood level 3. Chronic anticoagulation, stable her stroke risk is moderate and I would continue anticoagulation and she has had no bleeding complication 4. Hypertensive heart disease stable continue current treatment including ACE inhibitor thiazide diuretic and calcium channel blocker which she takes as needed for rapid heart rate.  I do think she will require repeat echocardiogram at the next visit  COVID-19 Education: The signs and symptoms of COVID-19 were discussed with the patient and how to seek care for testing (follow up with PCP or arrange E-visit).  The importance of social distancing was discussed today.  Time:   Today, I have spent 23 minutes with the patient with telehealth technology discussing the above problems.     Medication Adjustments/Labs and Tests Ordered: Current medicines are reviewed at length with the patient today.  Concerns regarding medicines are outlined above.   Tests Ordered: No orders of the defined types were placed in this encounter.   Medication Changes: No orders of the  defined types were placed in this encounter.   Disposition:  Follow up in 3 month(s)  Signed, Norman Herrlich, MD  08/17/2018 4:26 PM    [facility]

## 2018-09-17 DIAGNOSIS — Z96653 Presence of artificial knee joint, bilateral: Secondary | ICD-10-CM | POA: Diagnosis not present

## 2018-10-28 ENCOUNTER — Other Ambulatory Visit: Payer: Self-pay | Admitting: Cardiology

## 2018-11-16 ENCOUNTER — Ambulatory Visit: Payer: PPO | Admitting: Cardiology

## 2018-11-26 ENCOUNTER — Ambulatory Visit: Payer: PPO | Admitting: Cardiology

## 2018-12-06 ENCOUNTER — Ambulatory Visit (INDEPENDENT_AMBULATORY_CARE_PROVIDER_SITE_OTHER): Payer: PPO | Admitting: Cardiology

## 2018-12-06 ENCOUNTER — Encounter: Payer: Self-pay | Admitting: Cardiology

## 2018-12-06 ENCOUNTER — Other Ambulatory Visit: Payer: Self-pay

## 2018-12-06 VITALS — BP 122/74 | HR 59 | Ht 65.0 in | Wt 296.4 lb

## 2018-12-06 DIAGNOSIS — Z79899 Other long term (current) drug therapy: Secondary | ICD-10-CM

## 2018-12-06 DIAGNOSIS — I119 Hypertensive heart disease without heart failure: Secondary | ICD-10-CM

## 2018-12-06 DIAGNOSIS — I480 Paroxysmal atrial fibrillation: Secondary | ICD-10-CM | POA: Diagnosis not present

## 2018-12-06 DIAGNOSIS — Z7901 Long term (current) use of anticoagulants: Secondary | ICD-10-CM | POA: Diagnosis not present

## 2018-12-06 NOTE — Progress Notes (Signed)
Cardiology Office Note:    Date:  12/06/2018   ID:  Bynum BellowsJanice Robbins Coffey, DOB 10-28-1950, MRN [id number]  PCP:  Philemon KingdomProchnau, Caroline, MD  Cardiologist:  Norman HerrlichBrian Munley, MD    Referring MD: Philemon KingdomProchnau, Caroline, MD    ASSESSMENT:    1. PAF (paroxysmal atrial fibrillation) (HCC)   2. High risk medication use   3. Chronic anticoagulation   4. Hypertensive heart disease without heart failure    PLAN:    In order of problems listed above:  1. Stable maintain sinus rhythm continue current treatment including rate limiting calcium channel blocker and flecainide.  Her dose is lower QRS duration is normal and she has no evidence of toxicity. 2. Stable continue flecainide no evidence of toxicity on surface EKG 3. Continue her anticoagulant she has a moderate stroke risk 4. Stable BP at target continue current treatment with ACE inhibitor thiazide diuretic.   Next appointment: 6 months yes   Medication Adjustments/Labs and Tests Ordered: Current medicines are reviewed at length with the patient today.  Concerns regarding medicines are outlined above.  No orders of the defined types were placed in this encounter.  No orders of the defined types were placed in this encounter.   Chief Complaint  Patient presents with  . Follow-up  . Atrial Fibrillation    History of Present Illness:    Gina ShipperJanice Robbins Coffey is a 68 y.o. female with a hx of paroxysmal atrial fibrillation, anticoagulation and hypertension   last seen 03/21/28 after being placed on flecanide for recurrent PAF. She had a right TKA on 05/25/18.  She has had a marked improvement of the quality of her life after total knee arthroplasty  She was last seen 08/17/2018. Compliance with diet, lifestyle and medications: Yes  She continues to do well and has had no breakthrough atrial fibrillation no intolerance of flecainide no bleeding complications no palpitation edema shortness of breath or syncope.  She is pleased with  the quality of her life.  Most recent labs done at her PCP office 06/29/2018 showed normal renal function cholesterol 131 LDL 53 Past Medical History:  Diagnosis Date  . Anemia    hx of  . Anxiety    pt. denies  . Arthritis   . Depression   . Dysrhythmia    atrial fibrillation  . Family history of adverse reaction to anesthesia    brother aspirated and died . Mother confused after anesthesia  . Hypercholesteremia 08/05/2017  . Hypertension   . PAF (paroxysmal atrial fibrillation) (HCC) 04/29/2015   Overview:  [facility], CBC and CMP were normal, CHADS2 vasc score= 3, was discharged on a beta blocker and asprin  . Palpitations 08/05/2017  . Pneumonia    in 2006  . Primary localized osteoarthritis of left knee 12/09/2016  . Primary localized osteoarthritis of right knee 05/25/2018  . S/P knee replacement 12/09/2016   left  . Severe obesity (BMI >= 40) (HCC) 12/09/2016    Past Surgical History:  Procedure Laterality Date  . ABDOMINAL HYSTERECTOMY     1998  . APPENDECTOMY    . CARPAL TUNNEL RELEASE Right 2005  . DILATION AND CURETTAGE OF UTERUS  11/26/2016   1974x2  . TOTAL KNEE ARTHROPLASTY Left 12/09/2016   Procedure: TOTAL KNEE ARTHROPLASTY;  Surgeon: Teryl LucyLandau, Joshua, MD;  Location: [facility] OR;  Service: Orthopedics;  Laterality: Left;  . TOTAL KNEE ARTHROPLASTY Right 05/25/2018   Procedure: TOTAL KNEE ARTHROPLASTY;  Surgeon: Teryl LucyLandau, Joshua, MD;  Location: [facility] ORS;  Service: Orthopedics;  Laterality: Right;  Adductor Block    Current Medications: Current Meds  Medication Sig  . cetirizine (ZYRTEC) 10 MG tablet Take 10 mg by mouth daily.  . cholecalciferol (VITAMIN D) 1000 units tablet Take 1,000 Units by mouth daily. 25 mcg  . diltiazem (CARDIZEM CD) 120 MG 24 hr capsule Take 1 capsule (120 mg total) by mouth as needed. For heart rate greater than 120.  Marland Kitchen. ELIQUIS 5 MG TABS tablet Take 1 tablet (5 mg total) by mouth 2 (two) times daily. Patient needs  appointment for future refills.  Marland Kitchen. EPINEPHrine (EPIPEN 2-PAK) 0.3 mg/0.3 mL IJ SOAJ injection Inject 0.3 mg into the muscle daily as needed (for anaphylactic allergic reactions).  . flecainide (TAMBOCOR) 50 MG tablet TAKE 1 TABLET BY MOUTH TWICE DAILY  . lisinopril-hydrochlorothiazide (PRINZIDE,ZESTORETIC) 20-12.5 MG tablet Take 1 tablet by mouth daily.  . Omega-3 Fatty Acids (FISH OIL) 1000 MG CAPS Take 1,000 mg by mouth daily.  . rosuvastatin (CRESTOR) 20 MG tablet Take 20 mg by mouth every evening.  . sertraline (ZOLOFT) 50 MG tablet Take 50 mg by mouth daily.  . traMADol (ULTRAM) 50 MG tablet Take 50 mg by mouth every 6 (six) hours as needed (for pain.).  Marland Kitchen. vitamin E 400 UNIT capsule Take 400 Units by mouth daily.     Allergies:   Onion   Social History   Socioeconomic History  . Marital status: Widowed    Spouse name: Not on file  . Number of children: Not on file  . Years of education: Not on file  . Highest education level: Not on file  Occupational History  . Not on file  Social Needs  . Financial resource strain: Not on file  . Food insecurity    Worry: Not on file    Inability: Not on file  . Transportation needs    Medical: Not on file    Non-medical: Not on file  Tobacco Use  . Smoking status: Never Smoker  . Smokeless tobacco: Never Used  Substance and Sexual Activity  . Alcohol use: No  . Drug use: No  . Sexual activity: Not Currently  Lifestyle  . Physical activity    Days per week: Not on file    Minutes per session: Not on file  . Stress: Not on file  Relationships  . Social Musicianconnections    Talks on phone: Not on file    Gets together: Not on file    Attends religious service: Not on file    Active member of club or organization: Not on file    Attends meetings of clubs or organizations: Not on file    Relationship status: Not on file  Other Topics Concern  . Not on file  Social History Narrative  . Not on file     Family History: The  patient's family history includes Dementia in her mother; Heart disease in her father. ROS:   Please see the history of present illness.    All other systems reviewed and are negative.  EKGs/Labs/Other Studies Reviewed:    The following studies were reviewed today:  EKG:  EKG ordered today and personally reviewed.  The ekg ordered today demonstrates SRTH normal including QRS duration  Recent Labs: 05/26/2018: BUN 19; Creatinine, Ser 0.76; Hemoglobin 11.7; Platelets 207; Potassium 4.7; Sodium 136  Recent Lipid Panel No results found for: CHOL, TRIG, HDL, CHOLHDL, VLDL, LDLCALC, LDLDIRECT  Physical Exam:    VS:  BP 122/74 (BP Location: Right Arm, Patient Position: Sitting, Cuff Size: Large)   Pulse (!) 59   Ht 5\' 5"  (1.651 m)   Wt 296 lb 6.4 oz (134.4 kg)   SpO2 98%   BMI 49.32 kg/m     Wt Readings from Last 3 Encounters:  12/06/18 296 lb 6.4 oz (134.4 kg)  08/17/18 294 lb (133.4 kg)  05/25/18 292 lb (132.5 kg)     GEN:  Well nourished, well developed in no acute distress HEENT: Normal NECK: No JVD; No carotid bruits LYMPHATICS: No lymphadenopathy CARDIAC: RRR, no murmurs, rubs, gallops RESPIRATORY:  Clear to auscultation without rales, wheezing or rhonchi  ABDOMEN: Soft, non-tender, non-distended MUSCULOSKELETAL:  No edema; No deformity  SKIN: Warm and dry NEUROLOGIC:  Alert and oriented x 3 PSYCHIATRIC:  Normal affect    Signed, Shirlee More, MD  12/06/2018 3:48 PM    [facility]

## 2018-12-06 NOTE — Patient Instructions (Addendum)
Medication Instructions:  No medication changes today.  If you need a refill on your cardiac medications before your next appointment, please call your pharmacy.   Lab work: None ordered today.  If you have labs (blood work) drawn today and your tests are completely normal, you will receive your results only by: Marland Kitchen MyChart Message (if you have MyChart) OR . A paper copy in the mail If you have any lab test that is abnormal or we need to change your treatment, we will call you to review the results.  Testing/Procedures: You had an EKG today.  No additional testing ordered.   Follow-Up: At [facility], you and your health needs are our priority.  As part of our continuing mission to provide you with exceptional heart care, we have created designated Provider Care Teams.  These Care Teams include your primary Cardiologist (physician) and Advanced Practice Providers (APPs -  Physician Assistants and Nurse Practitioners) who all work together to provide you with the care you need, when you need it. . You will need a follow up appointment in 6 months.   Any Other Special Instructions Will Be Listed Below (If Applicable).

## 2018-12-08 NOTE — Addendum Note (Signed)
Addended by: Stevan Born on: 12/08/2018 01:06 PM   Modules accepted: Orders

## 2019-01-06 DIAGNOSIS — I10 Essential (primary) hypertension: Secondary | ICD-10-CM | POA: Diagnosis not present

## 2019-01-06 DIAGNOSIS — F419 Anxiety disorder, unspecified: Secondary | ICD-10-CM | POA: Diagnosis not present

## 2019-01-06 DIAGNOSIS — I480 Paroxysmal atrial fibrillation: Secondary | ICD-10-CM | POA: Diagnosis not present

## 2019-01-06 DIAGNOSIS — Z79899 Other long term (current) drug therapy: Secondary | ICD-10-CM | POA: Diagnosis not present

## 2019-01-06 DIAGNOSIS — Z1331 Encounter for screening for depression: Secondary | ICD-10-CM | POA: Diagnosis not present

## 2019-01-06 DIAGNOSIS — E785 Hyperlipidemia, unspecified: Secondary | ICD-10-CM | POA: Diagnosis not present

## 2019-01-06 DIAGNOSIS — Z6841 Body Mass Index (BMI) 40.0 and over, adult: Secondary | ICD-10-CM | POA: Diagnosis not present

## 2019-01-20 ENCOUNTER — Other Ambulatory Visit: Payer: Self-pay | Admitting: Cardiology

## 2019-02-08 ENCOUNTER — Telehealth: Payer: Self-pay | Admitting: Cardiology

## 2019-02-08 NOTE — Telephone Encounter (Signed)
Patient informed that eliquis 5 mg samples are available for pick up in the [place] office along with patient assistance paperwork to complete and return to our office to be faxed. Advised patient to contact her insurance company to see if there are any other financial assistance opportunities to help with the cost of eliquis. Patient is agreeable and verbalized understanding. No further questions.

## 2019-02-08 NOTE — Telephone Encounter (Signed)
[place] is in [place] and wants samples of Eliquis. Please call her.

## 2019-02-17 DIAGNOSIS — Z1211 Encounter for screening for malignant neoplasm of colon: Secondary | ICD-10-CM | POA: Diagnosis not present

## 2019-02-17 DIAGNOSIS — I10 Essential (primary) hypertension: Secondary | ICD-10-CM | POA: Diagnosis not present

## 2019-03-02 ENCOUNTER — Ambulatory Visit (INDEPENDENT_AMBULATORY_CARE_PROVIDER_SITE_OTHER): Payer: PPO

## 2019-03-02 ENCOUNTER — Ambulatory Visit (INDEPENDENT_AMBULATORY_CARE_PROVIDER_SITE_OTHER): Payer: PPO | Admitting: Sports Medicine

## 2019-03-02 ENCOUNTER — Other Ambulatory Visit: Payer: Self-pay

## 2019-03-02 ENCOUNTER — Other Ambulatory Visit: Payer: Self-pay | Admitting: Sports Medicine

## 2019-03-02 DIAGNOSIS — M7742 Metatarsalgia, left foot: Secondary | ICD-10-CM | POA: Diagnosis not present

## 2019-03-02 DIAGNOSIS — M79672 Pain in left foot: Secondary | ICD-10-CM

## 2019-03-02 DIAGNOSIS — M79675 Pain in left toe(s): Secondary | ICD-10-CM | POA: Diagnosis not present

## 2019-03-02 DIAGNOSIS — M779 Enthesopathy, unspecified: Secondary | ICD-10-CM

## 2019-03-02 MED ORDER — TRIAMCINOLONE ACETONIDE 10 MG/ML IJ SUSP
10.0000 mg | Freq: Once | INTRAMUSCULAR | Status: AC
  Administered 2019-03-02: 10 mg

## 2019-03-02 NOTE — Progress Notes (Signed)
Subjective: Gina Coffey is a 68 y.o. female patient who presents to office for evaluation of left foot pain.  Patient reports that it feels like he is walking on a rock with significant swelling at the left second and third toes has been present for the last month pain is 3 out of 10 constant in nature worse with walking and direct pressure to the area reports that the area feels swollen has tried icing and elevating without any improvement.  Patient denies any acute trauma or injury denies numbness or tingling to the affected toes or any other constitutional symptoms at this time.  Review of Systems  All other systems reviewed and are negative.   Patient Active Problem List   Diagnosis Date Noted  . Primary localized osteoarthritis of right knee 05/25/2018  . High risk medication use 04/06/2018  . Chronic anticoagulation 08/10/2017  . Palpitations 08/05/2017  . Hypertensive heart disease 08/05/2017  . Hypercholesteremia 08/05/2017  . Primary localized osteoarthritis of left knee 12/09/2016  . Severe obesity (BMI >= 40) ([place]) 12/09/2016  . S/P knee replacement 12/09/2016  . PAF (paroxysmal atrial fibrillation) ([place]) 04/29/2015    Current Outpatient Medications on File Prior to Visit  Medication Sig Dispense Refill  . cetirizine (ZYRTEC) 10 MG tablet Take 10 mg by mouth daily.    . cholecalciferol (VITAMIN D) 1000 units tablet Take 1,000 Units by mouth daily. 25 mcg    . diltiazem (CARDIZEM CD) 120 MG 24 hr capsule Take 1 capsule (120 mg total) by mouth as needed. For heart rate greater than 120. 90 capsule 3  . ELIQUIS 5 MG TABS tablet Take 1 tablet (5 mg total) by mouth 2 (two) times daily. Patient needs appointment for future refills. 42 tablet 0  . EPINEPHrine (EPIPEN 2-PAK) 0.3 mg/0.3 mL IJ SOAJ injection Inject 0.3 mg into the muscle daily as needed (for anaphylactic allergic reactions).    . flecainide (TAMBOCOR) 50 MG tablet TAKE 1 TABLET BY MOUTH TWICE DAILY 60 tablet  5  . lisinopril-hydrochlorothiazide (PRINZIDE,ZESTORETIC) 20-12.5 MG tablet Take 1 tablet by mouth daily.    . Omega-3 Fatty Acids (FISH OIL) 1000 MG CAPS Take 1,000 mg by mouth daily.    . rosuvastatin (CRESTOR) 20 MG tablet Take 20 mg by mouth every evening.    . sertraline (ZOLOFT) 50 MG tablet Take 50 mg by mouth daily.    . traMADol (ULTRAM) 50 MG tablet Take 50 mg by mouth every 6 (six) hours as needed (for pain.).    Marland Kitchen vitamin E 400 UNIT capsule Take 400 Units by mouth daily.     No current facility-administered medications on file prior to visit.     Allergies  Allergen Reactions  . Onion Itching, Swelling and Rash    Objective:  General: Alert and oriented x3 in no acute distress  Dermatology: No open lesions bilateral lower extremities, no webspace macerations, no ecchymosis bilateral, all nails x 10 are well manicured.  Vascular: Dorsalis Pedis and Posterior Tibial pedal pulses palpable, Capillary Fill Time 3 seconds,(+) pedal hair growth bilateral, no edema bilateral lower extremities, Temperature gradient within normal limits.  Neurology: Johney Maine sensation intact via light touch bilateral.   Musculoskeletal: Mild tenderness with palpation at sub met 2-3 on left foot, + splay of digits left 2-3 toes. No pain to calf.   Gait: Antalgic gait  Xrays  Left Foot   Impression: Normal osseous mineralization, there is mild enlargement at the head of the metatarsal consistent with bunion  formation, there is mild digital contracture of the lesser toes on the left foot there is mild soft tissue swelling and medial subluxation of the second toe at the level of the metatarsophalangeal joint consistent with possible predislocation syndrome.  No other acute findings.  Assessment and Plan: Problem List Items Addressed This Visit    None    Visit Diagnoses    Capsulitis    -  Primary   Metatarsalgia of left foot       Toe pain, left           -Complete examination  performed -Xrays reviewed -Discussed treatment options -After oral consent and aseptic prep, injected a mixture containing 1 ml of 2%  plain lidocaine, 1 ml 0.5% plain marcaine, 0.5 ml of kenalog 10 and 0.5 ml of dexamethasone phosphate into left second metatarsophalangeal joint without complication. Post-injection care discussed with patient.  -Rx metatarsal sleeve padding  -Patient to return to office as needed or sooner if condition worsens.  Landis Martins, DPM

## 2019-03-11 ENCOUNTER — Other Ambulatory Visit: Payer: Self-pay | Admitting: Sports Medicine

## 2019-03-11 DIAGNOSIS — M779 Enthesopathy, unspecified: Secondary | ICD-10-CM

## 2019-03-31 DIAGNOSIS — Z6841 Body Mass Index (BMI) 40.0 and over, adult: Secondary | ICD-10-CM | POA: Diagnosis not present

## 2019-03-31 DIAGNOSIS — N390 Urinary tract infection, site not specified: Secondary | ICD-10-CM | POA: Diagnosis not present

## 2019-04-23 DIAGNOSIS — J0100 Acute maxillary sinusitis, unspecified: Secondary | ICD-10-CM | POA: Diagnosis not present

## 2019-04-29 DIAGNOSIS — S161XXA Strain of muscle, fascia and tendon at neck level, initial encounter: Secondary | ICD-10-CM | POA: Diagnosis not present

## 2019-05-03 ENCOUNTER — Other Ambulatory Visit: Payer: Self-pay | Admitting: Internal Medicine

## 2019-05-03 DIAGNOSIS — Z1231 Encounter for screening mammogram for malignant neoplasm of breast: Secondary | ICD-10-CM

## 2019-05-30 DIAGNOSIS — Z1211 Encounter for screening for malignant neoplasm of colon: Secondary | ICD-10-CM | POA: Diagnosis not present

## 2019-06-10 ENCOUNTER — Other Ambulatory Visit: Payer: Self-pay

## 2019-06-10 ENCOUNTER — Ambulatory Visit
Admission: RE | Admit: 2019-06-10 | Discharge: 2019-06-10 | Disposition: A | Discharge status: Home / Self Care | Payer: PPO | Source: Ambulatory Visit | Attending: Internal Medicine | Admitting: Internal Medicine

## 2019-06-10 DIAGNOSIS — Z1231 Encounter for screening mammogram for malignant neoplasm of breast: Secondary | ICD-10-CM | POA: Diagnosis not present

## 2019-07-27 ENCOUNTER — Other Ambulatory Visit: Payer: Self-pay | Admitting: Cardiology

## 2019-08-26 ENCOUNTER — Other Ambulatory Visit: Payer: Self-pay | Admitting: Cardiology

## 2019-08-29 ENCOUNTER — Other Ambulatory Visit: Payer: Self-pay | Admitting: Cardiology

## 2019-09-25 DIAGNOSIS — J324 Chronic pansinusitis: Secondary | ICD-10-CM | POA: Diagnosis not present

## 2019-09-26 ENCOUNTER — Other Ambulatory Visit: Payer: Self-pay | Admitting: Cardiology

## 2019-09-26 MED ORDER — FLECAINIDE ACETATE 50 MG PO TABS: ORAL_TABLET | ORAL | 0 refills | Status: DC

## 2019-09-26 NOTE — Telephone Encounter (Signed)
Refill sent in per verbal ok from Dr. Dulce Sellar

## 2019-09-26 NOTE — Telephone Encounter (Signed)
New Message     *STAT* If patient is at the pharmacy, call can be transferred to refill team.   1. Which medications need to be refilled? (please list name of each medication and dose if known) flecainide (TAMBOCOR) 50 MG tablet  2. Which pharmacy/location (including street and city if local pharmacy) is medication to be sent to? [facility] - [place] - [address]  3. Do they need a 30 day or 90 day supply? Pt only needs enough until next appt

## 2019-10-03 ENCOUNTER — Other Ambulatory Visit: Payer: Self-pay

## 2019-10-07 ENCOUNTER — Other Ambulatory Visit: Payer: Self-pay

## 2019-10-07 ENCOUNTER — Ambulatory Visit: Payer: PPO | Admitting: Cardiology

## 2019-10-07 ENCOUNTER — Encounter: Payer: Self-pay | Admitting: Cardiology

## 2019-10-07 VITALS — BP 118/70 | HR 55 | Ht 65.0 in | Wt 299.2 lb

## 2019-10-07 DIAGNOSIS — Z79899 Other long term (current) drug therapy: Secondary | ICD-10-CM | POA: Diagnosis not present

## 2019-10-07 DIAGNOSIS — I480 Paroxysmal atrial fibrillation: Secondary | ICD-10-CM

## 2019-10-07 DIAGNOSIS — Z7901 Long term (current) use of anticoagulants: Secondary | ICD-10-CM | POA: Diagnosis not present

## 2019-10-07 DIAGNOSIS — I119 Hypertensive heart disease without heart failure: Secondary | ICD-10-CM | POA: Diagnosis not present

## 2019-10-07 MED ORDER — FLECAINIDE ACETATE 50 MG PO TABS: ORAL_TABLET | ORAL | 3 refills | Status: DC

## 2019-10-07 NOTE — Addendum Note (Signed)
Addended by: Halima Fogal, Elmarie Shiley L on: 10/07/2019 04:26 PM   Modules accepted: Orders

## 2019-10-07 NOTE — Patient Instructions (Signed)

## 2019-10-07 NOTE — Progress Notes (Signed)
Cardiology Office Note:    Date:  10/07/2019   ID:  Bynum Bellows Enhaut, DOB [birth year], MRN [id number]  PCP:  Philemon Kingdom, MD  Cardiologist:  Norman Herrlich, MD    Referring MD: Philemon Kingdom, MD    ASSESSMENT:    1. PAF (paroxysmal atrial fibrillation) (HCC)   2. High risk medication use   3. Chronic anticoagulation   4. Hypertensive heart disease without heart failure    PLAN:    In order of problems listed above:  1. Atrial fibrillation is controlled maintaining sinus rhythm continue low-dose flecainide and she takes rate slowing medication only as needed for breakthrough rapid heart rhythm.  She has moderate stroke risk, continue her anticoagulant. 2. BP at target continue current treatment ACE inhibitor thiazide diuretic follow-up labs at wellness with her primary care physician 3. Stable dyslipidemia continue her statin   Next appointment: She will see me in the office in 1 year.   Medication Adjustments/Labs and Tests Ordered: Current medicines are reviewed at length with the patient today.  Concerns regarding medicines are outlined above.  No orders of the defined types were placed in this encounter.  No orders of the defined types were placed in this encounter.   Chief Complaint  Patient presents with  . Follow-up  . Atrial Fibrillation    History of Present Illness:    Gina Coffey is a 69 y.o. female with a hx of  paroxysmal atrial fibrillation on flecainide, anticoagulation and hypertension  last seen 12/06/2019 Compliance with diet, lifestyle and medications: Yes  She has done well has had no recurrent atrial fibrillation has not needed to take rate slowing diltiazem and tolerates flecainide without side effect.  Her EKG today shows narrow QRS without 1C antiarrhythmic toxicity sinus bradycardia 55 bpm.  She tolerates her anticoagulant without bleeding complication.  She has had no edema shortness of breath chest pain or  syncope.  She lost her footing and fell in the last week but was not injured.  She uses a cane now for support.  Most recent labs 01/06/2019 cholesterol 123 HDL 42 LDL 53 creatinine 0.89 TSH normal 3.21 Past Medical History:  Diagnosis Date  . Anemia    hx of  . Anxiety    pt. denies  . Arthritis   . Depression   . Dysrhythmia    atrial fibrillation  . Family history of adverse reaction to anesthesia    brother aspirated and died . Mother confused after anesthesia  . Hypercholesteremia 08/05/2017  . Hypertension   . PAF (paroxysmal atrial fibrillation) (HCC) 04/29/2015   Overview:  Brief, converted to [facility] while in [facility] ED, CBC and CMP were normal, CHADS2 vasc score= 3, was discharged on a beta blocker and asprin  . Palpitations 08/05/2017  . Pneumonia    in 2006  . Primary localized osteoarthritis of left knee 12/09/2016  . Primary localized osteoarthritis of right knee 05/25/2018  . S/P knee replacement 12/09/2016   left  . Severe obesity (BMI >= 40) (HCC) 12/09/2016    Past Surgical History:  Procedure Laterality Date  . ABDOMINAL HYSTERECTOMY     1998  . APPENDECTOMY    . CARPAL TUNNEL RELEASE Right 2005  . DILATION AND CURETTAGE OF UTERUS  11/26/2016   1974x2  . TOTAL KNEE ARTHROPLASTY Left 12/09/2016   Procedure: TOTAL KNEE ARTHROPLASTY;  Surgeon: Teryl Lucy, MD;  [facility]: [facility];  Service: Orthopedics;  Laterality: Left;  . TOTAL KNEE ARTHROPLASTY Right 05/25/2018  Procedure: TOTAL KNEE ARTHROPLASTY;  Surgeon: Teryl Lucy, MD;  Location: [facility] ORS;  Service: Orthopedics;  Laterality: Right;  Adductor Block    Current Medications: Current Meds  Medication Sig  . cetirizine (ZYRTEC) 10 MG tablet Take 10 mg by mouth daily.  . cholecalciferol (VITAMIN D) 1000 units tablet Take 1,000 Units by mouth daily. 25 mcg  . diltiazem (CARDIZEM CD) 120 MG 24 hr capsule Take 1 capsule (120 mg total) by mouth as needed. For heart rate greater than 120.  Marland Kitchen ELIQUIS 5 MG TABS tablet Take  1 tablet (5 mg total) by mouth 2 (two) times daily. Patient needs appointment for future refills.  Marland Kitchen EPINEPHrine (EPIPEN 2-PAK) 0.3 mg/0.3 mL IJ SOAJ injection Inject 0.3 mg into the muscle daily as needed (for anaphylactic allergic reactions).  . flecainide (TAMBOCOR) 50 MG tablet TAKE 1 TABLET BY MOUTH 2 TIMES DAILY **NO FURTHER REFILLS UNTIL SEEN IN CLINIC**  . lisinopril-hydrochlorothiazide (PRINZIDE,ZESTORETIC) 20-12.5 MG tablet Take 1 tablet by mouth daily.  . Omega-3 Fatty Acids (FISH OIL) 1000 MG CAPS Take 1,000 mg by mouth daily.  . rosuvastatin (CRESTOR) 20 MG tablet Take 20 mg by mouth every evening.  . sertraline (ZOLOFT) 50 MG tablet Take 50 mg by mouth daily.  . traMADol (ULTRAM) 50 MG tablet Take 50 mg by mouth every 6 (six) hours as needed (for pain.).  Marland Kitchen vitamin E 400 UNIT capsule Take 400 Units by mouth daily.     Allergies:   Onion   Social History   Socioeconomic History  . Marital status: Widowed    Spouse name: Not on file  . Number of children: Not on file  . Years of education: Not on file  . Highest education level: Not on file  Occupational History  . Not on file  Tobacco Use  . Smoking status: Never Smoker  . Smokeless tobacco: Never Used  Vaping Use  . Vaping Use: Never used  Substance and Sexual Activity  . Alcohol use: No  . Drug use: No  . Sexual activity: Not Currently  Other Topics Concern  . Not on file  Social History Narrative  . Not on file   Social Determinants of Health   Financial Resource Strain:   . Difficulty of Paying Living Expenses:   Food Insecurity:   . Worried About Programme researcher, broadcasting/film/video in the Last Year:   . Barista in the Last Year:   Transportation Needs:   . Freight forwarder (Medical):   Marland Kitchen Lack of Transportation (Non-Medical):   Physical Activity:   . Days of Exercise per Week:   . Minutes of Exercise per Session:   Stress:   . Feeling of Stress :   Social Connections:   . Frequency of Communication  with Friends and Family:   . Frequency of Social Gatherings with Friends and Family:   . Attends Religious Services:   . Active Member of Clubs or Organizations:   . Attends Banker Meetings:   Marland Kitchen Marital Status:      Family History: The patient's  paroxysmal atrial fibrillation, anticoagulation and hypertension    family history includes Dementia in her mother; Heart disease in her father. ROS:   Please see the history of present illness.    All other systems reviewed and are negative.  EKGs/Labs/Other Studies Reviewed:    The following studies were reviewed today:  EKG:  EKG ordered today and personally reviewed.  The ekg ordered today demonstrates  sinus bradycardia 55 bpm otherwise normal  Recent Labs: No results found for requested labs within last 8760 hours.  Recent Lipid Panel No results found for: CHOL, TRIG, HDL, CHOLHDL, VLDL, LDLCALC, LDLDIRECT  Physical Exam:    VS:  BP 118/70 (BP Location: Right Arm, Patient Position: Sitting, Cuff Size: Normal)   Pulse (!) 55   Ht 5\' 5"  (1.651 m)   Wt 299 lb 3.2 oz (135.7 kg)   SpO2 98%   BMI 49.79 kg/m     Wt Readings from Last 3 Encounters:  10/07/19 299 lb 3.2 oz (135.7 kg)  12/06/18 296 lb 6.4 oz (134.4 kg)  08/17/18 294 lb (133.4 kg)     GEN:  Well nourished, well developed in no acute distress HEENT: Normal NECK: No JVD; No carotid bruits LYMPHATICS: No lymphadenopathy CARDIAC: RRR, no murmurs, rubs, gallops RESPIRATORY:  Clear to auscultation without rales, wheezing or rhonchi  ABDOMEN: Soft, non-tender, non-distended MUSCULOSKELETAL:  No edema; No deformity  SKIN: Warm and dry NEUROLOGIC:  Alert and oriented x 3 PSYCHIATRIC:  Normal affect    Signed, Shirlee More, MD  10/07/2019 3:53 PM    [facility]

## 2019-11-15 ENCOUNTER — Other Ambulatory Visit: Payer: Self-pay

## 2019-11-17 DIAGNOSIS — R600 Localized edema: Secondary | ICD-10-CM | POA: Diagnosis not present

## 2019-12-27 DIAGNOSIS — I10 Essential (primary) hypertension: Secondary | ICD-10-CM | POA: Diagnosis not present

## 2019-12-27 DIAGNOSIS — E785 Hyperlipidemia, unspecified: Secondary | ICD-10-CM | POA: Diagnosis not present

## 2019-12-27 DIAGNOSIS — Z131 Encounter for screening for diabetes mellitus: Secondary | ICD-10-CM | POA: Diagnosis not present

## 2019-12-27 DIAGNOSIS — Z1159 Encounter for screening for other viral diseases: Secondary | ICD-10-CM | POA: Diagnosis not present

## 2019-12-27 DIAGNOSIS — Z79899 Other long term (current) drug therapy: Secondary | ICD-10-CM | POA: Diagnosis not present

## 2019-12-27 DIAGNOSIS — I480 Paroxysmal atrial fibrillation: Secondary | ICD-10-CM | POA: Diagnosis not present

## 2019-12-27 DIAGNOSIS — M25551 Pain in right hip: Secondary | ICD-10-CM | POA: Diagnosis not present

## 2019-12-27 DIAGNOSIS — Z0000 Encounter for general adult medical examination without abnormal findings: Secondary | ICD-10-CM | POA: Diagnosis not present

## 2019-12-27 DIAGNOSIS — Z1331 Encounter for screening for depression: Secondary | ICD-10-CM | POA: Diagnosis not present

## 2019-12-27 DIAGNOSIS — Z1339 Encounter for screening examination for other mental health and behavioral disorders: Secondary | ICD-10-CM | POA: Diagnosis not present

## 2020-03-26 DIAGNOSIS — J019 Acute sinusitis, unspecified: Secondary | ICD-10-CM | POA: Diagnosis not present

## 2020-03-26 DIAGNOSIS — J029 Acute pharyngitis, unspecified: Secondary | ICD-10-CM | POA: Diagnosis not present

## 2020-03-27 DIAGNOSIS — J019 Acute sinusitis, unspecified: Secondary | ICD-10-CM | POA: Diagnosis not present

## 2020-03-27 DIAGNOSIS — Z20828 Contact with and (suspected) exposure to other viral communicable diseases: Secondary | ICD-10-CM | POA: Diagnosis not present

## 2020-03-27 DIAGNOSIS — J029 Acute pharyngitis, unspecified: Secondary | ICD-10-CM | POA: Diagnosis not present

## 2020-06-12 ENCOUNTER — Other Ambulatory Visit: Payer: Self-pay | Admitting: Internal Medicine

## 2020-06-12 DIAGNOSIS — Z1231 Encounter for screening mammogram for malignant neoplasm of breast: Secondary | ICD-10-CM

## 2020-06-28 DIAGNOSIS — I10 Essential (primary) hypertension: Secondary | ICD-10-CM | POA: Diagnosis not present

## 2020-06-28 DIAGNOSIS — Z1159 Encounter for screening for other viral diseases: Secondary | ICD-10-CM | POA: Diagnosis not present

## 2020-06-28 DIAGNOSIS — Z79899 Other long term (current) drug therapy: Secondary | ICD-10-CM | POA: Diagnosis not present

## 2020-06-28 DIAGNOSIS — Z131 Encounter for screening for diabetes mellitus: Secondary | ICD-10-CM | POA: Diagnosis not present

## 2020-06-28 DIAGNOSIS — F419 Anxiety disorder, unspecified: Secondary | ICD-10-CM | POA: Diagnosis not present

## 2020-06-28 DIAGNOSIS — E785 Hyperlipidemia, unspecified: Secondary | ICD-10-CM | POA: Diagnosis not present

## 2020-06-28 DIAGNOSIS — I480 Paroxysmal atrial fibrillation: Secondary | ICD-10-CM | POA: Diagnosis not present

## 2020-06-28 DIAGNOSIS — Z6841 Body Mass Index (BMI) 40.0 and over, adult: Secondary | ICD-10-CM | POA: Diagnosis not present

## 2020-06-28 DIAGNOSIS — D696 Thrombocytopenia, unspecified: Secondary | ICD-10-CM | POA: Diagnosis not present

## 2020-06-28 DIAGNOSIS — Z1331 Encounter for screening for depression: Secondary | ICD-10-CM | POA: Diagnosis not present

## 2020-08-01 ENCOUNTER — Ambulatory Visit
Admission: RE | Admit: 2020-08-01 | Discharge: 2020-08-01 | Disposition: A | Discharge status: Home / Self Care | Payer: PPO | Source: Ambulatory Visit | Attending: Internal Medicine | Admitting: Internal Medicine

## 2020-08-01 ENCOUNTER — Other Ambulatory Visit: Payer: Self-pay

## 2020-08-01 DIAGNOSIS — Z1231 Encounter for screening mammogram for malignant neoplasm of breast: Secondary | ICD-10-CM

## 2020-08-17 DIAGNOSIS — M1611 Unilateral primary osteoarthritis, right hip: Secondary | ICD-10-CM | POA: Diagnosis not present

## 2020-08-17 DIAGNOSIS — M25561 Pain in right knee: Secondary | ICD-10-CM | POA: Diagnosis not present

## 2020-08-17 DIAGNOSIS — M545 Low back pain, unspecified: Secondary | ICD-10-CM | POA: Diagnosis not present

## 2020-09-04 DIAGNOSIS — L570 Actinic keratosis: Secondary | ICD-10-CM | POA: Diagnosis not present

## 2020-09-04 DIAGNOSIS — L209 Atopic dermatitis, unspecified: Secondary | ICD-10-CM | POA: Diagnosis not present

## 2020-09-25 DIAGNOSIS — Z8489 Family history of other specified conditions: Secondary | ICD-10-CM | POA: Insufficient documentation

## 2020-09-25 DIAGNOSIS — D649 Anemia, unspecified: Secondary | ICD-10-CM | POA: Insufficient documentation

## 2020-09-25 DIAGNOSIS — M1990 Unspecified osteoarthritis, unspecified site: Secondary | ICD-10-CM | POA: Insufficient documentation

## 2020-09-25 DIAGNOSIS — I10 Essential (primary) hypertension: Secondary | ICD-10-CM | POA: Insufficient documentation

## 2020-09-25 DIAGNOSIS — J189 Pneumonia, unspecified organism: Secondary | ICD-10-CM | POA: Insufficient documentation

## 2020-09-25 DIAGNOSIS — I499 Cardiac arrhythmia, unspecified: Secondary | ICD-10-CM | POA: Insufficient documentation

## 2020-09-25 DIAGNOSIS — F419 Anxiety disorder, unspecified: Secondary | ICD-10-CM | POA: Insufficient documentation

## 2020-09-25 DIAGNOSIS — F32A Depression, unspecified: Secondary | ICD-10-CM | POA: Insufficient documentation

## 2020-10-14 NOTE — Progress Notes (Signed)
Cardiology Office Note:    Date:  10/15/2020   ID:  Gina Coffey, DOB 1950/08/29, MRN [id number]  PCP:  Philemon Kingdom, MD  Cardiologist:  Norman Herrlich, MD    Referring MD: Philemon Kingdom, MD    ASSESSMENT:    1. PAF (paroxysmal atrial fibrillation) (HCC)   2. High risk medication use   3. Chronic anticoagulation   4. Hypertensive heart disease without heart failure    PLAN:    In order of problems listed above:  She continues to do well with atrial fibrillation she is maintaining sinus rhythm on low-dose flecainide without toxicity and continues a calcium channel blocker for rate control. Continue flecainide low-dose Continue anticoagulant moderate stroke risk BP at target continue with current therapy including ACE inhibitor thiazide diuretic labs followed in her PCP office   Next appointment: 1 year    Medication Adjustments/Labs and Tests Ordered: Current medicines are reviewed at length with the patient today.  Concerns regarding medicines are outlined above.  Orders Placed This Encounter  Procedures   EKG 12-Lead   No orders of the defined types were placed in this encounter.   No chief complaint on file.   History of Present Illness:    Gina Coffey is a 70 y.o. female with a hx of paroxysmal atrial fibrillation maintaining sinus rhythm with flecainide and anticoagulated dyslipidemia and hypertension last seen 10/07/2019. Compliance with diet, lifestyle and medications: Yes  She tolerates her anticoagulant without any bleeding complication and flecainide without side effects. She has had no breakthrough episodes of atrial fibrillation palpitation or syncope. She is more active after knee surgery and has no shortness of breath orthopnea edema or chest pain. Past Medical History:  Diagnosis Date   Anemia    hx of   Anxiety    pt. denies   Arthritis    Depression    Dysrhythmia    atrial fibrillation   Family history of  adverse reaction to anesthesia    brother aspirated and died . Mother confused after anesthesia   Hypercholesteremia 08/05/2017   Hypertension    PAF (paroxysmal atrial fibrillation) (HCC) 04/29/2015   Overview:  Brief, converted to [facility] while in [facility] ED, CBC and CMP were normal, CHADS2 vasc score= 3, was discharged on a beta blocker and asprin   Palpitations 08/05/2017   Pneumonia    in 2006   Primary localized osteoarthritis of left knee 12/09/2016   Primary localized osteoarthritis of right knee 05/25/2018   S/P knee replacement 12/09/2016   left   Severe obesity (BMI >= 40) (HCC) 12/09/2016    Past Surgical History:  Procedure Laterality Date   ABDOMINAL HYSTERECTOMY     1998   APPENDECTOMY     CARPAL TUNNEL RELEASE Right 2005   DILATION AND CURETTAGE OF UTERUS  11/26/2016   1974x2   TOTAL KNEE ARTHROPLASTY Left 12/09/2016   Procedure: TOTAL KNEE ARTHROPLASTY;  Surgeon: Teryl Lucy, MD;  Location: [facility];  Service: Orthopedics;  Laterality: Left;   TOTAL KNEE ARTHROPLASTY Right 05/25/2018   Procedure: TOTAL KNEE ARTHROPLASTY;  Surgeon: Teryl Lucy, MD;  Location: [facility];  Service: Orthopedics;  Laterality: Right;  Adductor Block    Current Medications: Current Meds  Medication Sig   cetirizine (ZYRTEC) 10 MG tablet Take 10 mg by mouth daily.   cholecalciferol (VITAMIN D) 1000 units tablet Take 1,000 Units by mouth daily. 25 mcg   diltiazem (CARDIZEM CD) 120 MG 24 hr capsule Take 1 capsule (120 mg  total) by mouth as needed. For heart rate greater than 120.   ELIQUIS 5 MG TABS tablet Take 1 tablet (5 mg total) by mouth 2 (two) times daily. Patient needs appointment for future refills.   EPINEPHrine 0.3 mg/0.3 mL IJ SOAJ injection Inject 0.3 mg into the muscle daily as needed (for anaphylactic allergic reactions).   flecainide (TAMBOCOR) 50 MG tablet TAKE 1 TABLET BY MOUTH 2 TIMES DAILY   lisinopril-hydrochlorothiazide (PRINZIDE,ZESTORETIC) 20-12.5 MG tablet Take 1 tablet by mouth  daily.   Omega-3 Fatty Acids (FISH OIL) 1000 MG CAPS Take 1,000 mg by mouth daily.   rosuvastatin (CRESTOR) 20 MG tablet Take 20 mg by mouth every evening.   traMADol (ULTRAM) 50 MG tablet Take 50 mg by mouth every 6 (six) hours as needed (for pain.).   vitamin E 400 UNIT capsule Take 400 Units by mouth daily.     Allergies:   Onion   Social History   Socioeconomic History   Marital status: Widowed    Spouse name: Not on file   Number of children: Not on file   Years of education: Not on file   Highest education level: Not on file  Occupational History   Not on file  Tobacco Use   Smoking status: Never   Smokeless tobacco: Never  Vaping Use   Vaping Use: Never used  Substance and Sexual Activity   Alcohol use: No   Drug use: No   Sexual activity: Not Currently  Other Topics Concern   Not on file  Social History Narrative   Not on file   Social Determinants of Health   Financial Resource Strain: Not on file  Food Insecurity: Not on file  Transportation Needs: Not on file  Physical Activity: Not on file  Stress: Not on file  Social Connections: Not on file     Family History: The patient's family history includes Dementia in her mother; Heart disease in her father. ROS:   Please see the history of present illness.    All other systems reviewed and are negative.  EKGs/Labs/Other Studies Reviewed:    The following studies were reviewed today:  EKG:  EKG ordered today and personally reviewed.  The ekg ordered today demonstrates sinus rhythm incomplete right bundle branch block no evidence of 1C antiarrhythmic drug toxicity  Recent Labs: 06/28/2020: Lipids at target cholesterol 122 LDL 52 triglycerides 135 HDL 46 A1c 5.7%  Physical Exam:    VS:  BP 120/68 (BP Location: Left Arm, Patient Position: Sitting, Cuff Size: Large)   Pulse 60   Ht 5\' 5"  (1.651 m)   Wt (!) 303 lb 9.6 oz (137.7 kg)   SpO2 98%   BMI 50.52 kg/m     Wt Readings from Last 3  Encounters:  10/15/20 (!) 303 lb 9.6 oz (137.7 kg)  10/07/19 299 lb 3.2 oz (135.7 kg)  12/06/18 296 lb 6.4 oz (134.4 kg)     GEN: BMI exceeds 50 well nourished, well developed in no acute distress HEENT: Normal NECK: No JVD; No carotid bruits LYMPHATICS: No lymphadenopathy CARDIAC: RRR, no murmurs, rubs, gallops RESPIRATORY:  Clear to auscultation without rales, wheezing or rhonchi  ABDOMEN: Soft, non-tender, non-distended MUSCULOSKELETAL:  No edema; No deformity  SKIN: Warm and dry NEUROLOGIC:  Alert and oriented x 3 PSYCHIATRIC:  Normal affect    Signed, 12/08/18, MD  10/15/2020 10:22 AM    [facility]

## 2020-10-15 ENCOUNTER — Other Ambulatory Visit: Payer: Self-pay

## 2020-10-15 ENCOUNTER — Encounter: Payer: Self-pay | Admitting: Cardiology

## 2020-10-15 ENCOUNTER — Ambulatory Visit: Payer: PPO | Admitting: Cardiology

## 2020-10-15 VITALS — BP 120/68 | HR 60 | Ht 65.0 in | Wt 303.6 lb

## 2020-10-15 DIAGNOSIS — Z7901 Long term (current) use of anticoagulants: Secondary | ICD-10-CM | POA: Diagnosis not present

## 2020-10-15 DIAGNOSIS — Z79899 Other long term (current) drug therapy: Secondary | ICD-10-CM | POA: Diagnosis not present

## 2020-10-15 DIAGNOSIS — I480 Paroxysmal atrial fibrillation: Secondary | ICD-10-CM

## 2020-10-15 DIAGNOSIS — I119 Hypertensive heart disease without heart failure: Secondary | ICD-10-CM

## 2020-10-15 NOTE — Patient Instructions (Signed)

## 2020-10-25 ENCOUNTER — Other Ambulatory Visit: Payer: Self-pay | Admitting: Cardiology

## 2020-10-26 IMAGING — MG DIGITAL SCREENING BILAT W/ TOMO W/ CAD
8 series · 8 of 24 positions shown · non-contrast
Comparison: Previous exam(s).

CLINICAL DATA: Screening.

EXAM:
DIGITAL SCREENING BILATERAL MAMMOGRAM WITH TOMO AND CAD

[L CC synth-2D]
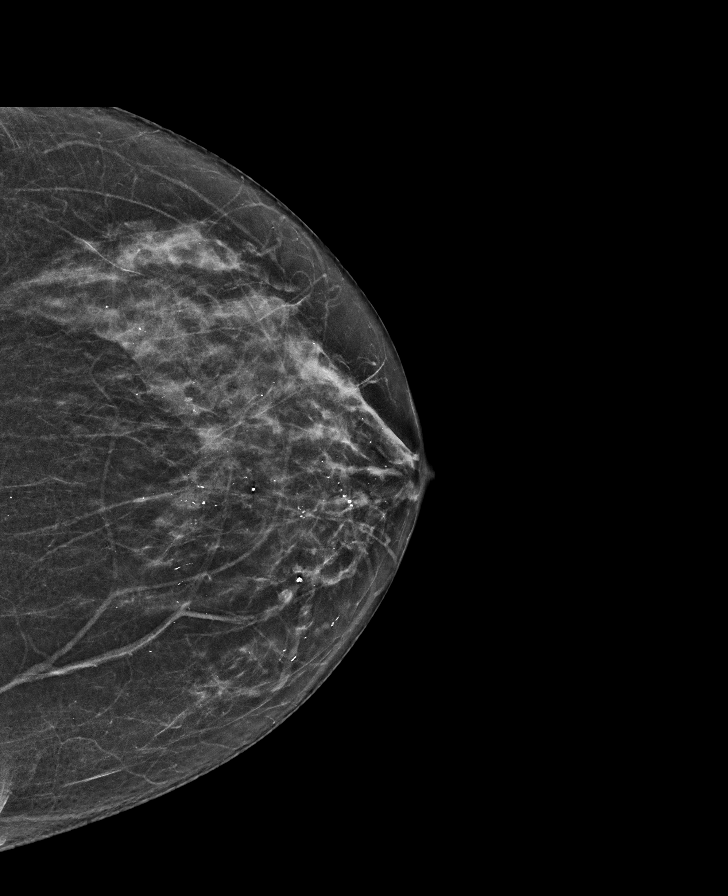

[R CC synth-2D]
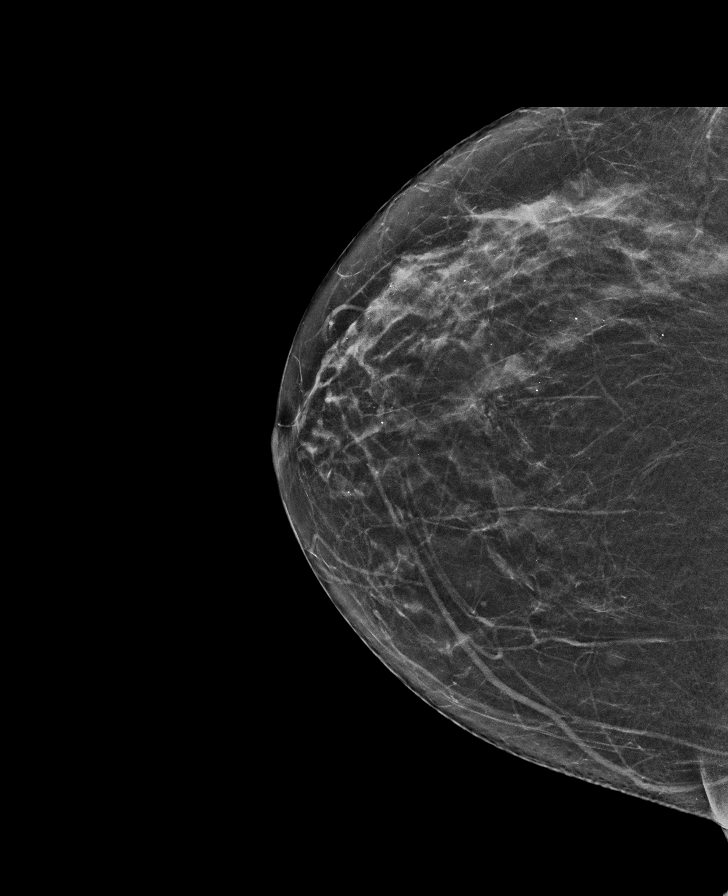

[L MLO synth-2D]
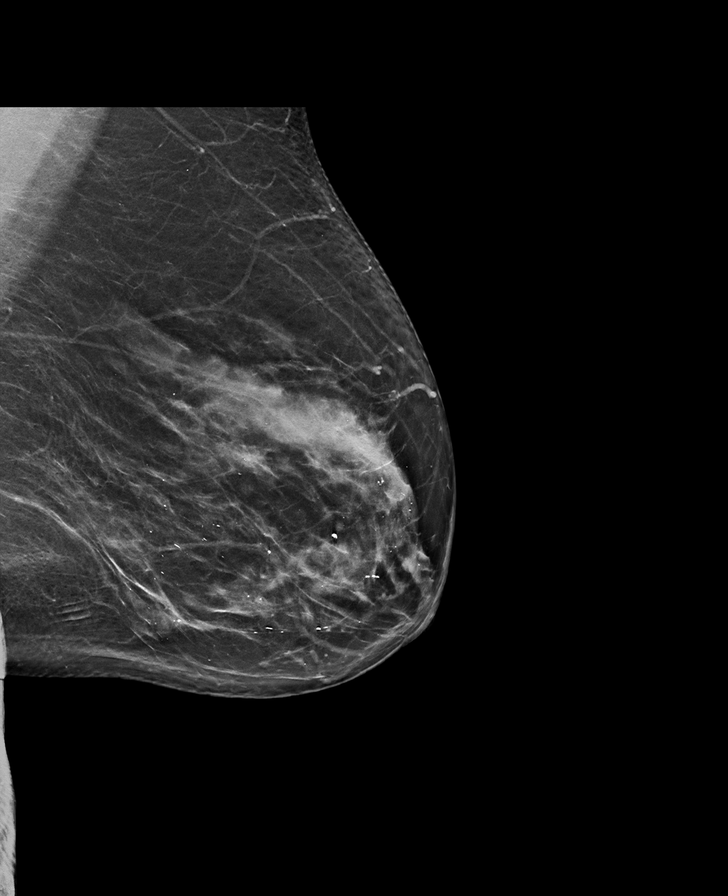

[R MLO synth-2D]
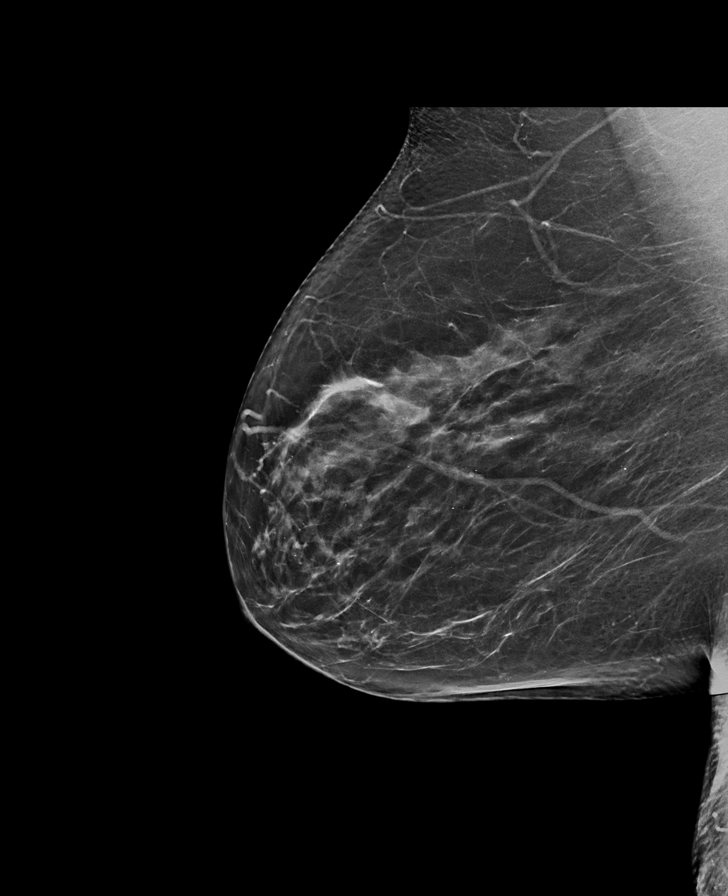

[L CC tomo · tomo slice 29/58.0]
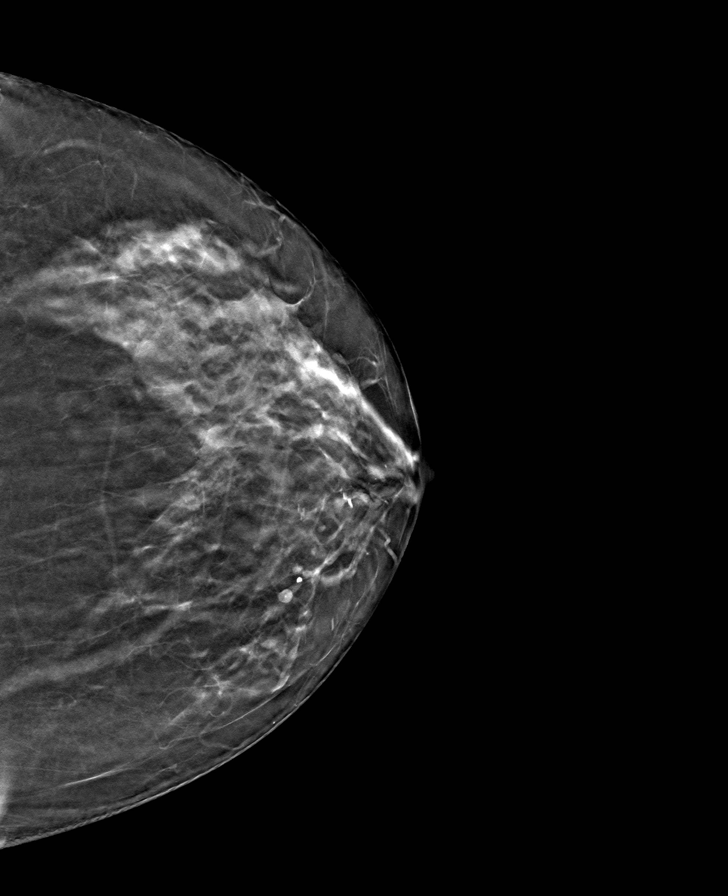

[R CC tomo · tomo slice 29/58.0]
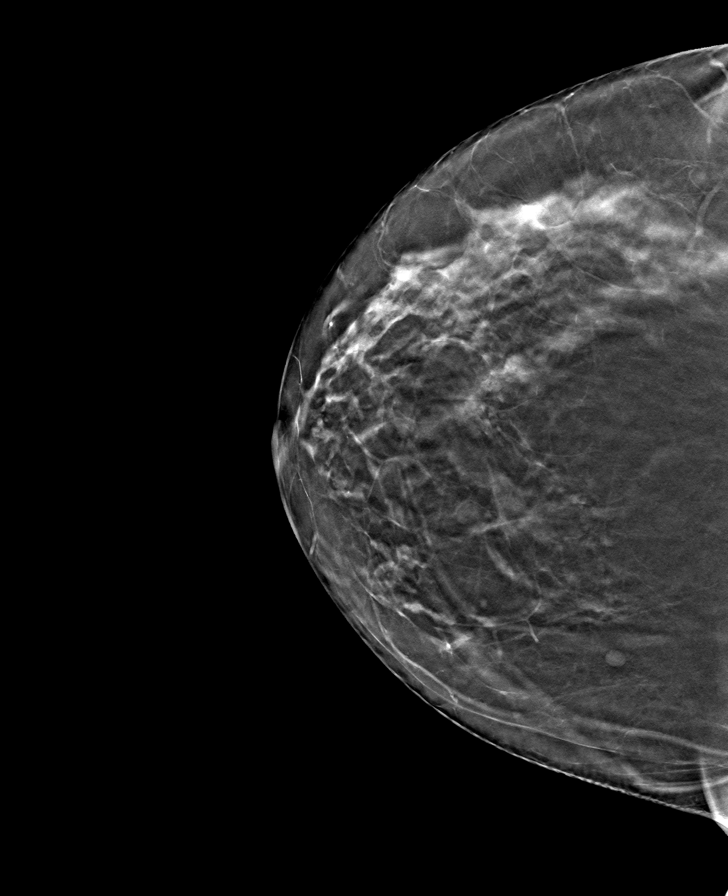

[R MLO tomo · tomo slice 37/74.0]
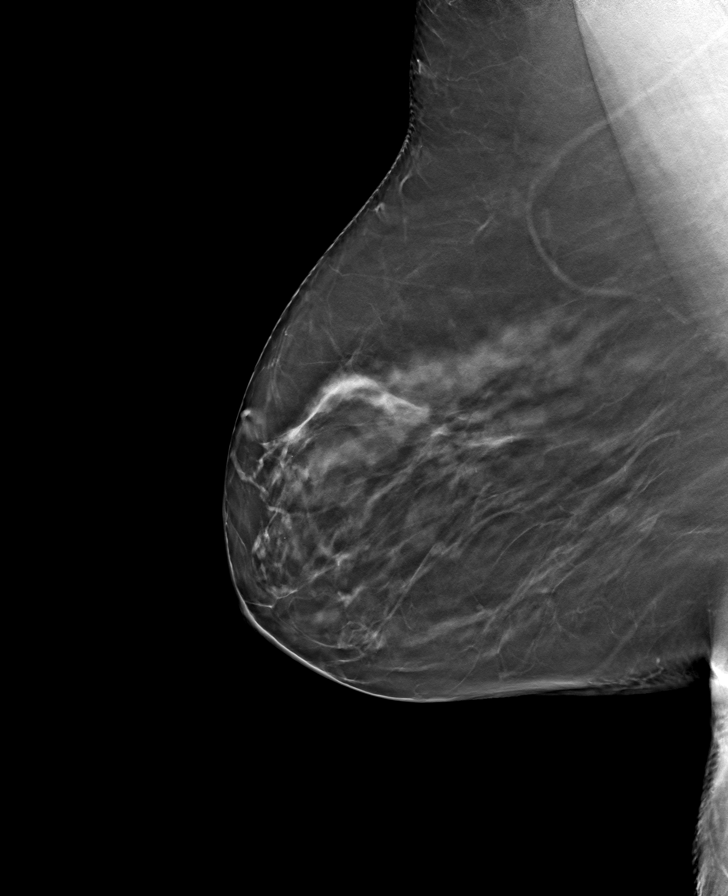

[L MLO tomo · tomo slice 39/76.0]
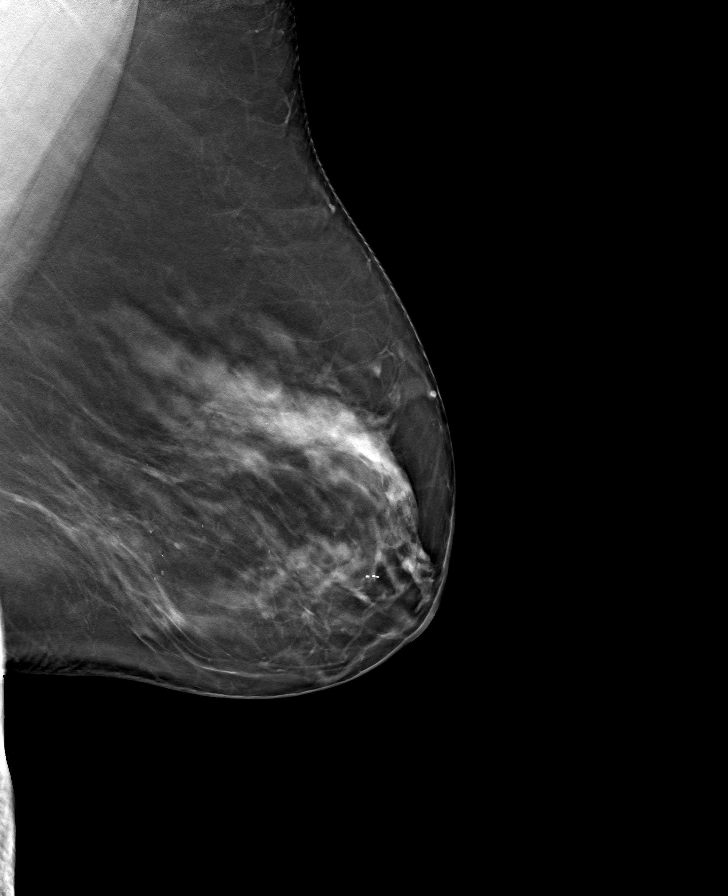

[8 of 24 positions shown; findings below may reference images not displayed]

ACR Breast Density Category c: The breast tissue is heterogeneously
dense, which may obscure small masses.
FINDINGS: There are no findings suspicious for malignancy. Images were
processed with CAD.
IMPRESSION: No mammographic evidence of malignancy. A result letter of this
screening mammogram will be mailed directly to the patient.

RECOMMENDATION:
Screening mammogram in one year. (Code:FT-U-LHB)

BI-RADS CATEGORY  1: Negative.

## 2020-10-30 DIAGNOSIS — J309 Allergic rhinitis, unspecified: Secondary | ICD-10-CM | POA: Diagnosis not present

## 2020-10-30 DIAGNOSIS — J069 Acute upper respiratory infection, unspecified: Secondary | ICD-10-CM | POA: Diagnosis not present

## 2021-01-14 DIAGNOSIS — J302 Other seasonal allergic rhinitis: Secondary | ICD-10-CM | POA: Diagnosis not present

## 2021-01-14 DIAGNOSIS — Z20828 Contact with and (suspected) exposure to other viral communicable diseases: Secondary | ICD-10-CM | POA: Diagnosis not present

## 2021-02-14 DIAGNOSIS — I480 Paroxysmal atrial fibrillation: Secondary | ICD-10-CM | POA: Diagnosis not present

## 2021-02-14 DIAGNOSIS — R7301 Impaired fasting glucose: Secondary | ICD-10-CM | POA: Diagnosis not present

## 2021-02-14 DIAGNOSIS — Z79899 Other long term (current) drug therapy: Secondary | ICD-10-CM | POA: Diagnosis not present

## 2021-02-14 DIAGNOSIS — Z1331 Encounter for screening for depression: Secondary | ICD-10-CM | POA: Diagnosis not present

## 2021-02-14 DIAGNOSIS — E785 Hyperlipidemia, unspecified: Secondary | ICD-10-CM | POA: Diagnosis not present

## 2021-02-14 DIAGNOSIS — Z0000 Encounter for general adult medical examination without abnormal findings: Secondary | ICD-10-CM | POA: Diagnosis not present

## 2021-02-14 DIAGNOSIS — Z1211 Encounter for screening for malignant neoplasm of colon: Secondary | ICD-10-CM | POA: Diagnosis not present

## 2021-02-14 DIAGNOSIS — I10 Essential (primary) hypertension: Secondary | ICD-10-CM | POA: Diagnosis not present

## 2021-05-24 DIAGNOSIS — G5602 Carpal tunnel syndrome, left upper limb: Secondary | ICD-10-CM | POA: Diagnosis not present

## 2021-06-13 DIAGNOSIS — H18413 Arcus senilis, bilateral: Secondary | ICD-10-CM | POA: Diagnosis not present

## 2021-06-13 DIAGNOSIS — H25043 Posterior subcapsular polar age-related cataract, bilateral: Secondary | ICD-10-CM | POA: Diagnosis not present

## 2021-06-13 DIAGNOSIS — H25013 Cortical age-related cataract, bilateral: Secondary | ICD-10-CM | POA: Diagnosis not present

## 2021-06-13 DIAGNOSIS — H2513 Age-related nuclear cataract, bilateral: Secondary | ICD-10-CM | POA: Diagnosis not present

## 2021-06-13 DIAGNOSIS — H2512 Age-related nuclear cataract, left eye: Secondary | ICD-10-CM | POA: Diagnosis not present

## 2021-06-24 ENCOUNTER — Telehealth: Payer: Self-pay

## 2021-06-24 DIAGNOSIS — G5602 Carpal tunnel syndrome, left upper limb: Secondary | ICD-10-CM | POA: Diagnosis not present

## 2021-06-24 NOTE — Telephone Encounter (Signed)
? ?  Pre-operative Risk Assessment  ?  ?Patient Name: Gina Coffey  ?DOB: [birth year] ?MRN: [id number]  ? ?  ? ?Request for Surgical Clearance   ? ?Procedure:   Cataract Extraction w/ Intraocular Lens Implantation of Left Eye, followed by the Right Eye ?Pie ?Date of Surgery:  Clearance 07/26/21                              ?   ?Surgeon:  Dr. Mia Creek ?Surgeon's Group or Practice Name:  [facility] ?Phone number:  [phone] ?Fax number:  [phone] ?  ?Type of Clearance Requested:   ?- Medical  ?  ?Type of Anesthesia:  Topical w/ IV medication ?  ?Additional requests/questions:   Patient does not need to stop any medication ? ?Signed, ?Viviano Simas   ?06/24/2021, 1:13 PM   ?

## 2021-06-24 NOTE — Telephone Encounter (Signed)
? ?  Patient Name: Gina Coffey  ?DOB: 1950/10/13 ?MRN: [id number] ? ?Primary Cardiologist: Norman Herrlich, MD ? ?Chart reviewed as part of pre-operative protocol coverage. Cataract extractions are recognized in guidelines as low risk surgeries that do not typically require specific preoperative testing or holding of blood thinner therapy. Therefore, given past medical history and time since last visit, based on ACC/AHA guidelines, Miquela Costabile would be at acceptable risk for the planned procedure without further cardiovascular testing.  ? ?I will route this recommendation to the requesting party via Epic fax function and remove from pre-op pool. ? ?Please call with questions. ? ?Levi Aland, NP-C ? ?  ?06/24/2021, 1:41 PM ?[facility] ?[address], [address] ?Office [phone] Fax [phone] ? ?

## 2021-07-26 DIAGNOSIS — H43393 Other vitreous opacities, bilateral: Secondary | ICD-10-CM | POA: Diagnosis not present

## 2021-07-26 DIAGNOSIS — H2511 Age-related nuclear cataract, right eye: Secondary | ICD-10-CM | POA: Diagnosis not present

## 2021-07-26 DIAGNOSIS — H16223 Keratoconjunctivitis sicca, not specified as Sjogren's, bilateral: Secondary | ICD-10-CM | POA: Diagnosis not present

## 2021-07-26 DIAGNOSIS — H2512 Age-related nuclear cataract, left eye: Secondary | ICD-10-CM | POA: Diagnosis not present

## 2021-07-26 DIAGNOSIS — H524 Presbyopia: Secondary | ICD-10-CM | POA: Diagnosis not present

## 2021-07-31 ENCOUNTER — Other Ambulatory Visit: Payer: Self-pay | Admitting: Internal Medicine

## 2021-07-31 DIAGNOSIS — Z1231 Encounter for screening mammogram for malignant neoplasm of breast: Secondary | ICD-10-CM

## 2021-08-02 ENCOUNTER — Ambulatory Visit
Admission: RE | Admit: 2021-08-02 | Discharge: 2021-08-02 | Disposition: A | Discharge status: Home / Self Care | Payer: PPO | Source: Ambulatory Visit | Attending: Internal Medicine | Admitting: Internal Medicine

## 2021-08-02 DIAGNOSIS — Z1231 Encounter for screening mammogram for malignant neoplasm of breast: Secondary | ICD-10-CM

## 2021-08-23 DIAGNOSIS — H2511 Age-related nuclear cataract, right eye: Secondary | ICD-10-CM | POA: Diagnosis not present

## 2021-10-29 ENCOUNTER — Other Ambulatory Visit: Payer: Self-pay

## 2021-10-30 ENCOUNTER — Ambulatory Visit: Payer: PPO | Admitting: Cardiology

## 2021-10-30 ENCOUNTER — Encounter: Payer: Self-pay | Admitting: Cardiology

## 2021-10-30 VITALS — BP 121/59 | HR 59 | Ht 65.0 in | Wt 306.0 lb

## 2021-10-30 DIAGNOSIS — Z79899 Other long term (current) drug therapy: Secondary | ICD-10-CM

## 2021-10-30 DIAGNOSIS — I480 Paroxysmal atrial fibrillation: Secondary | ICD-10-CM

## 2021-10-30 DIAGNOSIS — I119 Hypertensive heart disease without heart failure: Secondary | ICD-10-CM | POA: Diagnosis not present

## 2021-10-30 DIAGNOSIS — Z7901 Long term (current) use of anticoagulants: Secondary | ICD-10-CM | POA: Diagnosis not present

## 2021-10-30 NOTE — Patient Instructions (Signed)

## 2021-10-30 NOTE — Progress Notes (Signed)
Cardiology Office Note:    Date:  10/30/2021   ID:  Gina Coffey, DOB 06/23/1950, MRN [id number]  PCP:  Philemon Kingdom, MD  Cardiologist:  Norman Herrlich, MD    Referring MD: Philemon Kingdom, MD    ASSESSMENT:    1. PAF (paroxysmal atrial fibrillation) (HCC)   2. High risk medication use   3. Chronic anticoagulation   4. Hypertensive heart disease without heart failure    PLAN:    In order of problems listed above:  Gina Coffey continues to do well with minimal dose of flecainide she is maintaining sinus rhythm and tolerates her anticoagulant and will take same medications Blood pressure at target continue ACE inhibitor thiazide diuretic   Next appointment: 9 months for management of her antiarrhythmic drug   Medication Adjustments/Labs and Tests Ordered: Current medicines are reviewed at length with the patient today.  Concerns regarding medicines are outlined above.  Orders Placed This Encounter  Procedures   EKG 12-Lead   No orders of the defined types were placed in this encounter.   Chief complaint follow-up atrial fibrillation   History of Present Illness:    Gina Coffey is a 71 y.o. female with a hx of paroxysmal atrial fibrillation maintaining sinus rhythm with flecainide and anticoagulated dyslipidemia and hypertension  last seen 10/15/2021.  Compliance with diet, lifestyle and medications: Yes  Gina Coffey continues to do well she has had no recurrent atrial fibrillation tolerates flecainide without side effect as well and this Eliquis without bleeding complication Home blood pressures have been in range. No edema shortness of breath chest pain palpitation or syncope  Recent labs to the PCP office performed 02/14/2021 cholesterol 121 LDL 57 A1c 5.6% hemoglobin 13.9 creatinine 0.88 potassium 4 7 all these are normal or at target  Past Medical History:  Diagnosis Date   Anemia    hx of   Anxiety    pt. denies   Arthritis     Chronic anticoagulation 08/10/2017   Depression    Dysrhythmia    atrial fibrillation   Family history of adverse reaction to anesthesia    brother aspirated and died . Mother confused after anesthesia   High risk medication use 04/06/2018   Flecanide   Hypercholesteremia 08/05/2017   Hypertension    Hypertensive heart disease 08/05/2017   PAF (paroxysmal atrial fibrillation) (HCC) 04/29/2015   Overview:  Brief, converted to [facility] while in [facility] ED, CBC and CMP were normal, CHADS2 vasc score= 3, was discharged on a beta blocker and asprin   Palpitations 08/05/2017   Pneumonia    in 2006   Primary localized osteoarthritis of left knee 12/09/2016   Primary localized osteoarthritis of right knee 05/25/2018   S/P knee replacement 12/09/2016   left   Severe obesity (BMI >= 40) (HCC) 12/09/2016    Past Surgical History:  Procedure Laterality Date   ABDOMINAL HYSTERECTOMY     1998   APPENDECTOMY     CARPAL TUNNEL RELEASE Right 2005   DILATION AND CURETTAGE OF UTERUS  11/26/2016   1974x2   TOTAL KNEE ARTHROPLASTY Left 12/09/2016   Procedure: TOTAL KNEE ARTHROPLASTY;  Surgeon: Teryl Lucy, MD;  Location: [facility] OR;  Service: Orthopedics;  Laterality: Left;   TOTAL KNEE ARTHROPLASTY Right 05/25/2018   Procedure: TOTAL KNEE ARTHROPLASTY;  Surgeon: Teryl Lucy, MD;  Location: [facility];  Service: Orthopedics;  Laterality: Right;  Adductor Block    Current Medications: Current Meds  Medication Sig   cetirizine (ZYRTEC) 10 MG tablet  Take 10 mg by mouth daily.   cholecalciferol (VITAMIN D) 1000 units tablet Take 1,000 Units by mouth daily. 25 mcg   diltiazem (CARDIZEM CD) 120 MG 24 hr capsule Take 120 mg by mouth as needed (heart rate over 120).   ELIQUIS 5 MG TABS tablet Take 1 tablet (5 mg total) by mouth 2 (two) times daily. Patient needs appointment for future refills.   EPINEPHrine 0.3 mg/0.3 mL IJ SOAJ injection Inject 0.3 mg into the muscle daily as needed (for anaphylactic allergic reactions).    flecainide (TAMBOCOR) 50 MG tablet TAKE 1 TABLET BY MOUTH 2 TIMES DAILY   lisinopril-hydrochlorothiazide (PRINZIDE,ZESTORETIC) 20-12.5 MG tablet Take 1 tablet by mouth daily.   Omega-3 Fatty Acids (FISH OIL) 1000 MG CAPS Take 1,000 mg by mouth daily.   rosuvastatin (CRESTOR) 20 MG tablet Take 20 mg by mouth every evening.   sertraline (ZOLOFT) 50 MG tablet Take 50 mg by mouth daily.   traMADol (ULTRAM) 50 MG tablet Take 50 mg by mouth every 6 (six) hours as needed (for pain.).   vitamin E 400 UNIT capsule Take 400 Units by mouth daily.     Allergies:   Onion   Social History   Socioeconomic History   Marital status: Widowed    Spouse name: Not on file   Number of children: Not on file   Years of education: Not on file   Highest education level: Not on file  Occupational History   Not on file  Tobacco Use   Smoking status: Never   Smokeless tobacco: Never  Vaping Use   Vaping Use: Never used  Substance and Sexual Activity   Alcohol use: No   Drug use: No   Sexual activity: Not Currently  Other Topics Concern   Not on file  Social History Narrative   Not on file   Social Determinants of Health   Financial Resource Strain: Not on file  Food Insecurity: Not on file  Transportation Needs: Not on file  Physical Activity: Not on file  Stress: Not on file  Social Connections: Not on file     Family History: The patient's family history includes Dementia in her mother; Heart disease in her father. ROS:   Please see the history of present illness.    All other systems reviewed and are negative.  EKGs/Labs/Other Studies Reviewed:    The following studies were reviewed today:  EKG:  EKG ordered today and personally reviewed.  The ekg ordered today demonstrates sinus rhythm left axis deviation right bundle branch block   Physical Exam:    VS:  BP (!) 121/59   Pulse (!) 59   Ht 5\' 5"  (1.651 m)   Wt (!) 306 lb (138.8 kg)   SpO2 95%   BMI 50.92 kg/m     Wt Readings  from Last 3 Encounters:  10/30/21 (!) 306 lb (138.8 kg)  10/15/20 (!) 303 lb 9.6 oz (137.7 kg)  10/07/19 299 lb 3.2 oz (135.7 kg)     GEN:  Well nourished, well developed in no acute distress HEENT: Normal NECK: No JVD; No carotid bruits LYMPHATICS: No lymphadenopathy CARDIAC:    RRR, no murmurs, rubs, gallops RESPIRATORY:  Clear to auscultation without rales, wheezing or rhonchi  ABDOMEN: Soft, non-tender, non-distended MUSCULOSKELETAL:  No edema; No deformity  SKIN: Warm and dry NEUROLOGIC:  Alert and oriented x 3 PSYCHIATRIC:  Normal affect    Signed, 10/09/19, MD  10/30/2021 10:58 AM    [facility]

## 2021-11-04 ENCOUNTER — Other Ambulatory Visit: Payer: Self-pay | Admitting: Cardiology

## 2021-11-04 ENCOUNTER — Encounter: Payer: Self-pay | Admitting: Cardiology

## 2022-01-20 DIAGNOSIS — R0981 Nasal congestion: Secondary | ICD-10-CM | POA: Diagnosis not present

## 2022-01-20 DIAGNOSIS — J069 Acute upper respiratory infection, unspecified: Secondary | ICD-10-CM | POA: Diagnosis not present

## 2022-01-20 DIAGNOSIS — R059 Cough, unspecified: Secondary | ICD-10-CM | POA: Diagnosis not present

## 2022-02-13 ENCOUNTER — Telehealth: Payer: Self-pay | Admitting: Cardiology

## 2022-02-13 NOTE — Telephone Encounter (Signed)
Patient calling the office for samples of medication:   1.  What medication and dosage are you requesting samples for?   ELIQUIS 5 MG TABS tablet    2.  Are you currently out of this medication? Patient is in the donut hole   

## 2022-02-14 NOTE — Telephone Encounter (Signed)
Samples of Eliquis 5 mg left for patient to pick up in [place] office. Patient made aware

## 2022-02-26 DIAGNOSIS — Z1331 Encounter for screening for depression: Secondary | ICD-10-CM | POA: Diagnosis not present

## 2022-02-26 DIAGNOSIS — Z6841 Body Mass Index (BMI) 40.0 and over, adult: Secondary | ICD-10-CM | POA: Diagnosis not present

## 2022-02-26 DIAGNOSIS — Z0000 Encounter for general adult medical examination without abnormal findings: Secondary | ICD-10-CM | POA: Diagnosis not present

## 2022-02-26 DIAGNOSIS — I10 Essential (primary) hypertension: Secondary | ICD-10-CM | POA: Diagnosis not present

## 2022-02-26 DIAGNOSIS — Z1211 Encounter for screening for malignant neoplasm of colon: Secondary | ICD-10-CM | POA: Diagnosis not present

## 2022-02-26 DIAGNOSIS — E785 Hyperlipidemia, unspecified: Secondary | ICD-10-CM | POA: Diagnosis not present

## 2022-02-26 DIAGNOSIS — F419 Anxiety disorder, unspecified: Secondary | ICD-10-CM | POA: Diagnosis not present

## 2022-02-26 DIAGNOSIS — D6859 Other primary thrombophilia: Secondary | ICD-10-CM | POA: Diagnosis not present

## 2022-02-26 DIAGNOSIS — I480 Paroxysmal atrial fibrillation: Secondary | ICD-10-CM | POA: Diagnosis not present

## 2022-02-26 DIAGNOSIS — Z79899 Other long term (current) drug therapy: Secondary | ICD-10-CM | POA: Diagnosis not present

## 2022-03-03 DIAGNOSIS — Z1211 Encounter for screening for malignant neoplasm of colon: Secondary | ICD-10-CM | POA: Diagnosis not present

## 2022-04-28 DIAGNOSIS — R946 Abnormal results of thyroid function studies: Secondary | ICD-10-CM | POA: Diagnosis not present

## 2022-04-28 DIAGNOSIS — R748 Abnormal levels of other serum enzymes: Secondary | ICD-10-CM | POA: Diagnosis not present

## 2022-06-04 DIAGNOSIS — R0981 Nasal congestion: Secondary | ICD-10-CM | POA: Diagnosis not present

## 2022-06-04 DIAGNOSIS — R070 Pain in throat: Secondary | ICD-10-CM | POA: Diagnosis not present

## 2022-06-04 DIAGNOSIS — R059 Cough, unspecified: Secondary | ICD-10-CM | POA: Diagnosis not present

## 2022-07-29 ENCOUNTER — Other Ambulatory Visit: Payer: Self-pay | Admitting: Internal Medicine

## 2022-07-29 DIAGNOSIS — Z0000 Encounter for general adult medical examination without abnormal findings: Secondary | ICD-10-CM

## 2022-07-31 DIAGNOSIS — J209 Acute bronchitis, unspecified: Secondary | ICD-10-CM | POA: Diagnosis not present

## 2022-07-31 DIAGNOSIS — J0100 Acute maxillary sinusitis, unspecified: Secondary | ICD-10-CM | POA: Diagnosis not present

## 2022-08-26 DIAGNOSIS — R946 Abnormal results of thyroid function studies: Secondary | ICD-10-CM | POA: Diagnosis not present

## 2022-09-16 ENCOUNTER — Ambulatory Visit: Payer: PPO

## 2022-10-02 ENCOUNTER — Other Ambulatory Visit: Payer: Self-pay | Admitting: Cardiology

## 2022-10-10 ENCOUNTER — Ambulatory Visit
Admission: RE | Admit: 2022-10-10 | Discharge: 2022-10-10 | Disposition: A | Discharge status: Home / Self Care | Payer: PPO | Source: Ambulatory Visit | Attending: Internal Medicine | Admitting: Internal Medicine

## 2022-10-10 DIAGNOSIS — Z1231 Encounter for screening mammogram for malignant neoplasm of breast: Secondary | ICD-10-CM | POA: Diagnosis not present

## 2022-10-10 DIAGNOSIS — Z0000 Encounter for general adult medical examination without abnormal findings: Secondary | ICD-10-CM

## 2022-12-17 NOTE — Progress Notes (Unsigned)
Cardiology Office Note:    Date:  12/18/2022   ID:  Gina Coffey, Gina Coffey 1950-12-06, MRN [id number]  PCP:  Philemon Kingdom, MD  Cardiologist:  Norman Herrlich, MD    Referring MD: Philemon Kingdom, MD    ASSESSMENT:    1. PAF (paroxysmal atrial fibrillation) (HCC)   2. High risk medication use   3. Chronic anticoagulation   4. Hypertensive heart disease without heart failure   5. Elevated TSH    PLAN:    In order of problems listed above:  Continues to do well long-term with flecainide maintaining sinus rhythm well-tolerated no finding of toxicity from EKG review will continue her 50 mg twice daily along with Eliquis and rate slowing calcium channel blocker. Blood pressure is well-controlled continue ACE inhibitor thiazide diuretic She has upcoming labs in November with her PCP we discussed thyroid disease and the need to initiate replacement if her circulating hormone is diminished.   Next appointment: 9 months   Medication Adjustments/Labs and Tests Ordered: Current medicines are reviewed at length with the patient today.  Concerns regarding medicines are outlined above.  Orders Placed This Encounter  Procedures   EKG 12-Lead   No orders of the defined types were placed in this encounter.    History of Present Illness:    Gina Coffey is a 72 y.o. female with a hx of paroxysmal atrial fibrillation maintaining sinus rhythm with flecainide chronic anticoagulation hypertension and dyslipidemia last seen 10/30/2021.  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 denies   Arthritis    Chronic anticoagulation 08/10/2017   Depression    Dysrhythmia    atrial fibrillation   Family history of adverse reaction to anesthesia    brother aspirated and died . Mother confused after anesthesia   High risk medication use 04/06/2018   Flecanide   Hypercholesteremia 08/05/2017   Hypertension    Hypertensive heart disease 08/05/2017   PAF (paroxysmal atrial fibrillation) (HCC) 04/29/2015   Overview:  Brief, converted to [facility] while in [facility] ED, CBC and CMP were normal, CHADS2 vasc score= 3, was discharged on a beta blocker and asprin   Palpitations 08/05/2017   Pneumonia    in 2006   Primary localized osteoarthritis of left knee 12/09/2016   Primary localized osteoarthritis of right knee 05/25/2018   S/P knee replacement 12/09/2016   left   Severe obesity (BMI >= 40) (HCC) 12/09/2016    Current Medications: Current Meds  Medication Sig   cetirizine (ZYRTEC) 10 MG tablet Take 10 mg by mouth daily.   cholecalciferol (VITAMIN D) 1000 units tablet Take 1,000 Units by mouth daily. 25 mcg   diltiazem (CARDIZEM CD) 120 MG 24 hr capsule Take 120 mg by mouth as needed (heart rate over 120).   ELIQUIS 5 MG TABS tablet Take 1 tablet (5 mg total) by mouth 2 (two) times daily. Patient needs appointment for future refills.   EPINEPHrine 0.3  mg/0.3 mL IJ SOAJ injection Inject 0.3 mg into the muscle daily as needed (for anaphylactic allergic reactions).   flecainide (TAMBOCOR) 50 MG tablet TAKE 1 TABLET BY MOUTH 2 TIMES DAILY   lisinopril-hydrochlorothiazide (PRINZIDE,ZESTORETIC) 20-12.5 MG tablet Take 1 tablet by mouth daily.   Omega-3 Fatty Acids (FISH OIL) 1000 MG CAPS Take 1,000 mg by mouth daily.   sertraline (ZOLOFT) 50 MG tablet Take 50 mg by mouth daily.   traMADol (ULTRAM) 50 MG tablet Take 50 mg by mouth every 6 (six) hours as needed (for pain.).    vitamin E 400 UNIT capsule Take 400 Units by mouth daily.      EKGs/Labs/Other Studies Reviewed:    The following studies were reviewed today:  EKG Interpretation Date/Time:  Thursday December 18 2022 14:07:15 EDT Ventricular Rate:  64 PR Interval:  166 QRS Duration:  118 QT Interval:  424 QTC Calculation: 437 R Axis:   -30  Text Interpretation: Normal sinus rhythm Left axis deviation Left ventricular hypertrophy with QRS widening ( R in aVL , Cornell product ) When compared with ECG of 27-Nov-2016 10:25, No significant change was found Confirmed by Norman Herrlich ([id number]) on 12/18/2022 2:20:39 PM   Recent Labs: No results found for requested labs within last 365 days.  Recent Lipid Panel No results found for: "CHOL", "TRIG", "HDL", "CHOLHDL", "VLDL", "LDLCALC", "LDLDIRECT"  Physical Exam:    VS:  BP 130/70 (BP Location: Left Arm, Patient Position: Sitting, Cuff Size: Normal)   Pulse 64   Ht 5\' 4"  (1.626 m)   Wt (!) 308 lb 12.8 oz (140.1 kg)   SpO2 97%   BMI 53.01 kg/m     Wt Readings from Last 3 Encounters:  12/18/22 (!) 308 lb 12.8 oz (140.1 kg)  10/30/21 (!) 306 lb (138.8 kg)  10/15/20 (!) 303 lb 9.6 oz (137.7 kg)     GEN:  Well nourished, well developed in no acute distress HEENT: Normal NECK: No JVD; No carotid bruits LYMPHATICS: No lymphadenopathy CARDIAC: RRR, no murmurs, rubs, gallops RESPIRATORY:  Clear to auscultation without rales, wheezing or rhonchi  ABDOMEN: Soft, non-tender, non-distended MUSCULOSKELETAL:  No edema; No deformity  SKIN: Warm and dry NEUROLOGIC:  Alert and oriented x 3 PSYCHIATRIC:  Normal affect    Signed, Norman Herrlich, MD  12/18/2022 2:37 PM    [facility]

## 2022-12-18 ENCOUNTER — Ambulatory Visit: Payer: PPO | Attending: Cardiology | Admitting: Cardiology

## 2022-12-18 ENCOUNTER — Encounter: Payer: Self-pay | Admitting: Cardiology

## 2022-12-18 ENCOUNTER — Other Ambulatory Visit: Payer: Self-pay

## 2022-12-18 VITALS — BP 130/70 | HR 64 | Ht 64.0 in | Wt 308.8 lb

## 2022-12-18 DIAGNOSIS — Z7901 Long term (current) use of anticoagulants: Secondary | ICD-10-CM | POA: Diagnosis not present

## 2022-12-18 DIAGNOSIS — I480 Paroxysmal atrial fibrillation: Secondary | ICD-10-CM

## 2022-12-18 DIAGNOSIS — I119 Hypertensive heart disease without heart failure: Secondary | ICD-10-CM | POA: Diagnosis not present

## 2022-12-18 DIAGNOSIS — R7989 Other specified abnormal findings of blood chemistry: Secondary | ICD-10-CM | POA: Diagnosis not present

## 2022-12-18 DIAGNOSIS — Z79899 Other long term (current) drug therapy: Secondary | ICD-10-CM

## 2022-12-18 MED ORDER — APIXABAN 5 MG PO TABS: 5.0000 mg | ORAL_TABLET | Freq: Two times a day (BID) | ORAL | Status: DC

## 2022-12-18 NOTE — Patient Instructions (Signed)
Medication Instructions:  Your physician recommends that you continue on your current medications as directed. Please refer to the Current Medication list given to you today.  *If you need a refill on your cardiac medications before your next appointment, please call your pharmacy*   Lab Work: None If you have labs (blood work) drawn today and your tests are completely normal, you will receive your results only by: MyChart Message (if you have MyChart) OR A paper copy in the mail If you have any lab test that is abnormal or we need to change your treatment, we will call you to review the results.   Testing/Procedures: None   Follow-Up: At [facility], you and your health needs are our priority.  As part of our continuing mission to provide you with exceptional heart care, we have created designated Provider Care Teams.  These Care Teams include your primary Cardiologist (physician) and Advanced Practice Providers (APPs -  Physician Assistants and Nurse Practitioners) who all work together to provide you with the care you need, when you need it.  We recommend signing up for the patient portal called "MyChart".  Sign up information is provided on this After Visit Summary.  MyChart is used to connect with patients for Virtual Visits (Telemedicine).  Patients are able to view lab/test results, encounter notes, upcoming appointments, etc.  Non-urgent messages can be sent to your provider as well.   To learn more about what you can do with MyChart, go to [email or web address].    Your next appointment:   9 month(s)  Provider:   Brian Munley, MD    Other Instructions None  

## 2023-02-03 DIAGNOSIS — J324 Chronic pansinusitis: Secondary | ICD-10-CM | POA: Diagnosis not present

## 2023-02-03 DIAGNOSIS — R0981 Nasal congestion: Secondary | ICD-10-CM | POA: Diagnosis not present

## 2023-02-03 DIAGNOSIS — R051 Acute cough: Secondary | ICD-10-CM | POA: Diagnosis not present

## 2023-02-15 DIAGNOSIS — R0981 Nasal congestion: Secondary | ICD-10-CM | POA: Diagnosis not present

## 2023-02-15 DIAGNOSIS — R509 Fever, unspecified: Secondary | ICD-10-CM | POA: Diagnosis not present

## 2023-02-15 DIAGNOSIS — R051 Acute cough: Secondary | ICD-10-CM | POA: Diagnosis not present

## 2023-02-15 DIAGNOSIS — J019 Acute sinusitis, unspecified: Secondary | ICD-10-CM | POA: Diagnosis not present

## 2023-03-04 DIAGNOSIS — I480 Paroxysmal atrial fibrillation: Secondary | ICD-10-CM | POA: Diagnosis not present

## 2023-03-04 DIAGNOSIS — Z1331 Encounter for screening for depression: Secondary | ICD-10-CM | POA: Diagnosis not present

## 2023-03-04 DIAGNOSIS — E785 Hyperlipidemia, unspecified: Secondary | ICD-10-CM | POA: Diagnosis not present

## 2023-03-04 DIAGNOSIS — Z6841 Body Mass Index (BMI) 40.0 and over, adult: Secondary | ICD-10-CM | POA: Diagnosis not present

## 2023-03-04 DIAGNOSIS — D6859 Other primary thrombophilia: Secondary | ICD-10-CM | POA: Diagnosis not present

## 2023-03-04 DIAGNOSIS — I10 Essential (primary) hypertension: Secondary | ICD-10-CM | POA: Diagnosis not present

## 2023-03-04 DIAGNOSIS — Z1211 Encounter for screening for malignant neoplasm of colon: Secondary | ICD-10-CM | POA: Diagnosis not present

## 2023-03-04 DIAGNOSIS — Z0000 Encounter for general adult medical examination without abnormal findings: Secondary | ICD-10-CM | POA: Diagnosis not present

## 2023-04-23 DIAGNOSIS — I10 Essential (primary) hypertension: Secondary | ICD-10-CM | POA: Diagnosis not present

## 2023-04-23 DIAGNOSIS — R946 Abnormal results of thyroid function studies: Secondary | ICD-10-CM | POA: Diagnosis not present

## 2023-04-23 DIAGNOSIS — Z79899 Other long term (current) drug therapy: Secondary | ICD-10-CM | POA: Diagnosis not present

## 2023-04-23 DIAGNOSIS — E785 Hyperlipidemia, unspecified: Secondary | ICD-10-CM | POA: Diagnosis not present

## 2023-05-22 DIAGNOSIS — I499 Cardiac arrhythmia, unspecified: Secondary | ICD-10-CM | POA: Diagnosis not present

## 2023-05-22 DIAGNOSIS — I480 Paroxysmal atrial fibrillation: Secondary | ICD-10-CM | POA: Diagnosis not present

## 2023-05-22 DIAGNOSIS — R509 Fever, unspecified: Secondary | ICD-10-CM | POA: Diagnosis not present

## 2023-05-22 DIAGNOSIS — R051 Acute cough: Secondary | ICD-10-CM | POA: Diagnosis not present

## 2023-05-22 DIAGNOSIS — Z7901 Long term (current) use of anticoagulants: Secondary | ICD-10-CM | POA: Diagnosis not present

## 2023-05-22 DIAGNOSIS — R9431 Abnormal electrocardiogram [ECG] [EKG]: Secondary | ICD-10-CM | POA: Diagnosis not present

## 2023-05-22 DIAGNOSIS — I10 Essential (primary) hypertension: Secondary | ICD-10-CM | POA: Diagnosis not present

## 2023-05-22 DIAGNOSIS — I4891 Unspecified atrial fibrillation: Secondary | ICD-10-CM | POA: Diagnosis not present

## 2023-05-22 DIAGNOSIS — R000 Tachycardia, unspecified: Secondary | ICD-10-CM | POA: Diagnosis not present

## 2023-05-22 DIAGNOSIS — E782 Mixed hyperlipidemia: Secondary | ICD-10-CM | POA: Diagnosis not present

## 2023-05-22 DIAGNOSIS — R0981 Nasal congestion: Secondary | ICD-10-CM | POA: Diagnosis not present

## 2023-05-22 DIAGNOSIS — J3489 Other specified disorders of nose and nasal sinuses: Secondary | ICD-10-CM | POA: Diagnosis not present

## 2023-05-22 DIAGNOSIS — Z79899 Other long term (current) drug therapy: Secondary | ICD-10-CM | POA: Diagnosis not present

## 2023-05-22 DIAGNOSIS — E785 Hyperlipidemia, unspecified: Secondary | ICD-10-CM | POA: Diagnosis not present

## 2023-05-22 DIAGNOSIS — R059 Cough, unspecified: Secondary | ICD-10-CM | POA: Diagnosis not present

## 2023-05-22 DIAGNOSIS — Z743 Need for continuous supervision: Secondary | ICD-10-CM | POA: Diagnosis not present

## 2023-05-22 DIAGNOSIS — J069 Acute upper respiratory infection, unspecified: Secondary | ICD-10-CM | POA: Diagnosis not present

## 2023-05-22 DIAGNOSIS — R1110 Vomiting, unspecified: Secondary | ICD-10-CM | POA: Diagnosis not present

## 2023-05-23 DIAGNOSIS — I4891 Unspecified atrial fibrillation: Secondary | ICD-10-CM | POA: Diagnosis not present

## 2023-05-23 DIAGNOSIS — E782 Mixed hyperlipidemia: Secondary | ICD-10-CM | POA: Diagnosis not present

## 2023-05-23 DIAGNOSIS — G4733 Obstructive sleep apnea (adult) (pediatric): Secondary | ICD-10-CM | POA: Diagnosis not present

## 2023-05-23 DIAGNOSIS — B349 Viral infection, unspecified: Secondary | ICD-10-CM | POA: Diagnosis not present

## 2023-05-23 DIAGNOSIS — I10 Essential (primary) hypertension: Secondary | ICD-10-CM | POA: Diagnosis not present

## 2023-06-01 ENCOUNTER — Telehealth: Payer: Self-pay | Admitting: Cardiology

## 2023-06-01 DIAGNOSIS — J180 Bronchopneumonia, unspecified organism: Secondary | ICD-10-CM | POA: Diagnosis not present

## 2023-06-01 DIAGNOSIS — I480 Paroxysmal atrial fibrillation: Secondary | ICD-10-CM | POA: Diagnosis not present

## 2023-06-01 DIAGNOSIS — Z6841 Body Mass Index (BMI) 40.0 and over, adult: Secondary | ICD-10-CM | POA: Diagnosis not present

## 2023-06-01 NOTE — Telephone Encounter (Signed)
 Patient stated she was recently in the hospital recently and is not feeling better. Patient is scheduled for tomorrow afternoon with Dr. Sandee Crook.

## 2023-06-01 NOTE — Progress Notes (Deleted)
  Cardiology Office Note:    Date:  06/01/2023   ID:  Gina Coffey, Gina Coffey February 23, 1951, MRN [id number]  PCP:  Philemon Kingdom, MD  Cardiologist:  Norman Herrlich, MD    Referring MD: Philemon Kingdom, MD    ASSESSMENT:    No diagnosis found. PLAN:    In order of problems listed above:  ***   Next appointment: ***   Medication Adjustments/Labs and Tests Ordered: Current medicines are reviewed at length with the patient today.  Concerns regarding medicines are outlined above.  No orders of the defined types were placed in this encounter.  No orders of the defined types were placed in this encounter.    History of Present Illness:    Gina Coffey is a 73 y.o. female with a hx of *** last seen ***. Compliance with diet, lifestyle and medications: *** Past Medical History:  Diagnosis Date   Anemia    hx of   Anxiety    pt. denies   Arthritis    Chronic anticoagulation 08/10/2017   Depression    Dysrhythmia    atrial fibrillation   Family history of adverse reaction to anesthesia    brother aspirated and died . Mother confused after anesthesia   High risk medication use 04/06/2018   Flecanide   Hypercholesteremia 08/05/2017   Hypertension    Hypertensive heart disease 08/05/2017   PAF (paroxysmal atrial fibrillation) (HCC) 04/29/2015   Overview:  Brief, converted to [facility] while in [facility] ED, CBC and CMP were normal, CHADS2 vasc score= 3, was discharged on a beta blocker and asprin   Palpitations 08/05/2017   Pneumonia    in 2006   Primary localized osteoarthritis of left knee 12/09/2016   Primary localized osteoarthritis of right knee 05/25/2018   S/P knee replacement 12/09/2016   left   Severe obesity (BMI >= 40) (HCC) 12/09/2016    Current Medications: No outpatient medications have been marked as taking for the 06/02/23 encounter (Appointment) with Baldo Daub, MD.      EKGs/Labs/Other Studies Reviewed:    The following studies were  reviewed today:  Cardiac Studies & Procedures     STRESS TESTS  MYOCARDIAL PERFUSION IMAGING 02/11/2018                  Recent Labs: No results found for requested labs within last 365 days.  Recent Lipid Panel No results found for: "CHOL", "TRIG", "HDL", "CHOLHDL", "VLDL", "LDLCALC", "LDLDIRECT"  Physical Exam:    VS:  There were no vitals taken for this visit.    Wt Readings from Last 3 Encounters:  12/18/22 (!) 308 lb 12.8 oz (140.1 kg)  10/30/21 (!) 306 lb (138.8 kg)  10/15/20 (!) 303 lb 9.6 oz (137.7 kg)     GEN: *** Well nourished, well developed in no acute distress HEENT: Normal NECK: No JVD; No carotid bruits LYMPHATICS: No lymphadenopathy CARDIAC: ***RRR, no murmurs, rubs, gallops RESPIRATORY:  Clear to auscultation without rales, wheezing or rhonchi  ABDOMEN: Soft, non-tender, non-distended MUSCULOSKELETAL:  No edema; No deformity  SKIN: Warm and dry NEUROLOGIC:  Alert and oriented x 3 PSYCHIATRIC:  Normal affect    Signed, Norman Herrlich, MD  06/01/2023 3:09 PM    [facility]

## 2023-06-01 NOTE — Telephone Encounter (Signed)
 Called patient and she stated that she on 2/1 she went to the ER because she has a history of A-fib and her heart rate was elevated and the "bones in her face hurt". Her heart rate came back down to her normal heart rate and she was released from the ER. The following week she came down with the flu and is currently being treated for the flu. She has an appointment today with Dr. Kathyrn Parkinson and tomorrow with Dr. Sandee Crook. I encouraged her to let Dr. Kathyrn Parkinson know about her flu symptoms and how she still feels weak and is not getting any better. She states that her A-fib is currently under control. Patient had no further questions at this time.

## 2023-06-02 ENCOUNTER — Ambulatory Visit: Payer: PPO | Admitting: Cardiology

## 2023-06-22 NOTE — Progress Notes (Unsigned)
 Cardiology Office Note:    Date:  06/23/2023   ID:  Gina Coffey, DOB [birth year], MRN [id number]  PCP:  Philemon Kingdom, MD  Cardiologist:  Norman Herrlich, MD    Referring MD: Philemon Kingdom, MD    ASSESSMENT:    1. PAF (paroxysmal atrial fibrillation) (HCC)   2. High risk medication use   3. Chronic anticoagulation   4. Hypertensive heart disease without heart failure    PLAN:    In order of problems listed above:  1 brief episode in the context of a flulike illness I told her to be careful and tell people not to give amiodarone on top of flecainide in the future without the input of cardiology you can get very untoward effects even perhaps VTE as a consequence Continue her flecainide effective well-tolerated along with her current anticoagulant Eliquis 5 mg daily Will check an EKG today she did not have a repeat at the hospital after resuming sinus rhythm Hypertension is well-controlled with her ACE thiazide diuretic and diltiazem   Next appointment: 6 months   Medication Adjustments/Labs and Tests Ordered: Current medicines are reviewed at length with the patient today.  Concerns regarding medicines are outlined above.  No orders of the defined types were placed in this encounter.  No orders of the defined types were placed in this encounter.    History of Present Illness:    Gina Coffey is a 73 y.o. female with a hx of paroxysmal atrial fibrillation maintaining sinus rhythm with flecainide chronic anticoagulation hypertension and dyslipidemia last seen 12/18/2022.  Francis Dowse was seen at [facility] ED 05/23/2023 with concerns of influenza was found to be in atrial fibrillation.  She received IV amiodarone for rate control and converted to sinus rhythm.  She was seen by my partner in the hospital he advised putting her back on flecainide and continuing anticoagulation.  Laboratory studies showed a hemoglobin of 13.3 platelets 187 creatinine  0.9 potassium 3.5 she had 3 troponins assessed all of which were normal.  Compliance with diet, lifestyle and medications: Yes  I reviewed her history she was never confirmed to have influenza never took Tamiflu and she had a secondary bacterial infection that was treated with steroids and oral antibiotics  She has recovered She has no further Episodes of atrial fibrillation She is not having chest pain edema shortness of breath palpitation or syncope and she is taking Eliquis 5 mg daily Past Medical History:  Diagnosis Date   Anemia    hx of   Anxiety    pt. denies   Arthritis    Chronic anticoagulation 08/10/2017   Depression    Dysrhythmia    atrial fibrillation   Family history of adverse reaction to anesthesia    brother aspirated and died . Mother confused after anesthesia   High risk medication use 04/06/2018   Flecanide   Hypercholesteremia 08/05/2017   Hypertension    Hypertensive heart disease 08/05/2017   PAF (paroxysmal atrial fibrillation) (HCC) 04/29/2015   Overview:  Brief, converted to [facility] while in [facility] ED, CBC and CMP were normal, CHADS2 vasc score= 3, was discharged on a beta blocker and asprin   Palpitations 08/05/2017   Pneumonia    in 2006   Primary localized osteoarthritis of left knee 12/09/2016   Primary localized osteoarthritis of right knee 05/25/2018   S/P knee replacement 12/09/2016   left   Severe obesity (BMI >= 40) (HCC) 12/09/2016    Current Medications: Current Meds  Medication  Sig   albuterol (VENTOLIN HFA) 108 (90 Base) MCG/ACT inhaler Inhale 1-2 puffs into the lungs every 4 (four) hours as needed.   cetirizine (ZYRTEC) 10 MG tablet Take 10 mg by mouth daily.   cholecalciferol (VITAMIN D) 1000 units tablet Take 1,000 Units by mouth daily. 25 mcg   diltiazem (CARDIZEM CD) 120 MG 24 hr capsule Take 120 mg by mouth as needed (heart rate over 120).   ELIQUIS 5 MG TABS tablet Take 1 tablet (5 mg total) by mouth 2 (two) times daily. Patient needs  appointment for future refills.   EPINEPHrine 0.3 mg/0.3 mL IJ SOAJ injection Inject 0.3 mg into the muscle daily as needed (for anaphylactic allergic reactions).   flecainide (TAMBOCOR) 50 MG tablet TAKE 1 TABLET BY MOUTH 2 TIMES DAILY   lisinopril-hydrochlorothiazide (PRINZIDE,ZESTORETIC) 20-12.5 MG tablet Take 1 tablet by mouth daily.   Omega-3 Fatty Acids (FISH OIL) 1000 MG CAPS Take 1,000 mg by mouth daily.   sertraline (ZOLOFT) 50 MG tablet Take 50 mg by mouth daily.   traMADol (ULTRAM) 50 MG tablet Take 50 mg by mouth every 6 (six) hours as needed (for pain.).   vitamin E 400 UNIT capsule Take 400 Units by mouth daily.   [DISCONTINUED] azelastine (ASTELIN) 0.1 % nasal spray Place 2 sprays into both nostrils 2 (two) times daily.   [DISCONTINUED] brompheniramine-pseudoephedrine-DM 30-2-10 MG/5ML syrup Take 10 mLs by mouth every 6 (six) hours as needed.   [DISCONTINUED] ezetimibe (ZETIA) 10 MG tablet Take 10 mg by mouth daily.      EKGs/Labs/Other Studies Reviewed:    The following studies were reviewed today:         Recent Labs: No results found for requested labs within last 365 days.  Recent Lipid Panel No results found for: "CHOL", "TRIG", "HDL", "CHOLHDL", "VLDL", "LDLCALC", "LDLDIRECT"  Physical Exam:    VS:  BP 112/64   Pulse (!) 57   Ht 5\' 5"  (1.651 m)   Wt 298 lb 6.4 oz (135.4 kg)   SpO2 99%   BMI 49.66 kg/m     Wt Readings from Last 3 Encounters:  06/23/23 298 lb 6.4 oz (135.4 kg)  12/18/22 (!) 308 lb 12.8 oz (140.1 kg)  10/30/21 (!) 306 lb (138.8 kg)     GEN:  Well nourished, well developed in no acute distress HEENT: Normal NECK: No JVD; No carotid bruits LYMPHATICS: No lymphadenopathy CARDIAC: RRR, no murmurs, rubs, gallops RESPIRATORY:  Clear to auscultation without rales, wheezing or rhonchi  ABDOMEN: Soft, non-tender, non-distended MUSCULOSKELETAL:  No edema; No deformity  SKIN: Warm and dry NEUROLOGIC:  Alert and oriented x 3 PSYCHIATRIC:   Normal affect    Signed, Norman Herrlich, MD  06/23/2023 8:47 AM    [facility]

## 2023-06-23 ENCOUNTER — Encounter: Payer: Self-pay | Admitting: Cardiology

## 2023-06-23 ENCOUNTER — Ambulatory Visit: Payer: PPO | Attending: Cardiology | Admitting: Cardiology

## 2023-06-23 VITALS — BP 112/64 | HR 57 | Ht 65.0 in | Wt 298.4 lb

## 2023-06-23 DIAGNOSIS — I119 Hypertensive heart disease without heart failure: Secondary | ICD-10-CM

## 2023-06-23 DIAGNOSIS — I480 Paroxysmal atrial fibrillation: Secondary | ICD-10-CM

## 2023-06-23 DIAGNOSIS — Z79899 Other long term (current) drug therapy: Secondary | ICD-10-CM | POA: Diagnosis not present

## 2023-06-23 DIAGNOSIS — Z7901 Long term (current) use of anticoagulants: Secondary | ICD-10-CM

## 2023-06-23 NOTE — Patient Instructions (Addendum)
 Medication Instructions:  Your physician recommends that you continue on your current medications as directed. Please refer to the Current Medication list given to you today.  *If you need a refill on your cardiac medications before your next appointment, please call your pharmacy*   Lab Work: None If you have labs (blood work) drawn today and your tests are completely normal, you will receive your results only by: MyChart Message (if you have MyChart) OR A paper copy in the mail If you have any lab test that is abnormal or we need to change your treatment, we will call you to review the results.   Testing/Procedures: None   Follow-Up: At [facility], you and your health needs are our priority.  As part of our continuing mission to provide you with exceptional heart care, we have created designated Provider Care Teams.  These Care Teams include your primary Cardiologist (physician) and Advanced Practice Providers (APPs -  Physician Assistants and Nurse Practitioners) who all work together to provide you with the care you need, when you need it.  We recommend signing up for the patient portal called "MyChart".  Sign up information is provided on this After Visit Summary.  MyChart is used to connect with patients for Virtual Visits (Telemedicine).  Patients are able to view lab/test results, encounter notes, upcoming appointments, etc.  Non-urgent messages can be sent to your provider as well.   To learn more about what you can do with MyChart, go to [email or web address].    Your next appointment:   6 month(s)  Provider:   Norman Herrlich, MD  Other Instructions Avoid Amiodarone without cardiology input  Purchase a kardia mobile          KardiaMobile [email or web address]        FDA-cleared, clinical grade mobile EKG monitor: Lourena Simmonds is the most clinically-validated mobile EKG used by the world's leading cardiac care medical  professionals With Basic service, know instantly if your heart rhythm is normal or if atrial fibrillation is detected, and email the last single EKG recording to yourself or your doctor Premium service, available for purchase through the Kardia app for $9.99 per month or $99 per year, includes unlimited history and storage of your EKG recordings, a monthly EKG summary report to share with your doctor, along with the ability to track your blood pressure, activity and weight Includes one KardiaMobile phone clip FREE SHIPPING: Standard delivery 1-3 business days. Orders placed by 11:00am PST will ship that afternoon. Otherwise, will ship next business day. All orders ship via [employer] from [place], [place]

## 2023-06-26 DIAGNOSIS — M160 Bilateral primary osteoarthritis of hip: Secondary | ICD-10-CM | POA: Diagnosis not present

## 2023-06-26 DIAGNOSIS — M169 Osteoarthritis of hip, unspecified: Secondary | ICD-10-CM | POA: Diagnosis not present

## 2023-06-26 DIAGNOSIS — M25552 Pain in left hip: Secondary | ICD-10-CM | POA: Diagnosis not present

## 2023-06-26 DIAGNOSIS — X500XXA Overexertion from strenuous movement or load, initial encounter: Secondary | ICD-10-CM | POA: Diagnosis not present

## 2023-06-26 DIAGNOSIS — Z90710 Acquired absence of both cervix and uterus: Secondary | ICD-10-CM | POA: Diagnosis not present

## 2023-06-26 DIAGNOSIS — S76012A Strain of muscle, fascia and tendon of left hip, initial encounter: Secondary | ICD-10-CM | POA: Diagnosis not present

## 2023-06-26 DIAGNOSIS — M47816 Spondylosis without myelopathy or radiculopathy, lumbar region: Secondary | ICD-10-CM | POA: Diagnosis not present

## 2023-06-26 DIAGNOSIS — N83202 Unspecified ovarian cyst, left side: Secondary | ICD-10-CM | POA: Diagnosis not present

## 2023-07-16 DIAGNOSIS — K5730 Diverticulosis of large intestine without perforation or abscess without bleeding: Secondary | ICD-10-CM | POA: Diagnosis not present

## 2023-07-16 DIAGNOSIS — K4090 Unilateral inguinal hernia, without obstruction or gangrene, not specified as recurrent: Secondary | ICD-10-CM | POA: Diagnosis not present

## 2023-07-16 DIAGNOSIS — R1900 Intra-abdominal and pelvic swelling, mass and lump, unspecified site: Secondary | ICD-10-CM | POA: Diagnosis not present

## 2023-07-20 DIAGNOSIS — R1900 Intra-abdominal and pelvic swelling, mass and lump, unspecified site: Secondary | ICD-10-CM | POA: Diagnosis not present

## 2023-08-28 ENCOUNTER — Other Ambulatory Visit: Payer: Self-pay | Admitting: Internal Medicine

## 2023-08-28 DIAGNOSIS — Z1231 Encounter for screening mammogram for malignant neoplasm of breast: Secondary | ICD-10-CM

## 2023-09-21 DIAGNOSIS — I480 Paroxysmal atrial fibrillation: Secondary | ICD-10-CM | POA: Diagnosis not present

## 2023-09-21 DIAGNOSIS — Z6841 Body Mass Index (BMI) 40.0 and over, adult: Secondary | ICD-10-CM | POA: Diagnosis not present

## 2023-09-21 DIAGNOSIS — D6859 Other primary thrombophilia: Secondary | ICD-10-CM | POA: Diagnosis not present

## 2023-09-21 DIAGNOSIS — I10 Essential (primary) hypertension: Secondary | ICD-10-CM | POA: Diagnosis not present

## 2023-09-21 DIAGNOSIS — E785 Hyperlipidemia, unspecified: Secondary | ICD-10-CM | POA: Diagnosis not present

## 2023-09-24 ENCOUNTER — Other Ambulatory Visit: Payer: Self-pay | Admitting: Cardiology

## 2023-10-12 ENCOUNTER — Ambulatory Visit
Admission: RE | Admit: 2023-10-12 | Discharge: 2023-10-12 | Disposition: A | Discharge status: Home / Self Care | Source: Ambulatory Visit | Attending: Internal Medicine | Admitting: Internal Medicine

## 2023-10-12 DIAGNOSIS — Z1231 Encounter for screening mammogram for malignant neoplasm of breast: Secondary | ICD-10-CM

## 2023-10-27 DIAGNOSIS — L578 Other skin changes due to chronic exposure to nonionizing radiation: Secondary | ICD-10-CM | POA: Diagnosis not present

## 2023-10-27 DIAGNOSIS — L820 Inflamed seborrheic keratosis: Secondary | ICD-10-CM | POA: Diagnosis not present

## 2023-10-27 DIAGNOSIS — L821 Other seborrheic keratosis: Secondary | ICD-10-CM | POA: Diagnosis not present

## 2024-01-14 NOTE — Progress Notes (Unsigned)
 Cardiology Office Note:    Date:  01/14/2024   ID:  Gina, Coffey March 13, 1951, MRN [id number]  PCP:  Jefferey Fitch, MD  Cardiologist:  Redell Leiter, MD    Referring MD: Jefferey Fitch, MD    ASSESSMENT:    1. PAF (paroxysmal atrial fibrillation) (HCC)   2. Chronic anticoagulation   3. High risk medication use   4. Hypertensive heart disease without heart failure    PLAN:    In order of problems listed above:  [name] with her atrial fibrillation maintaining sinus rhythm with low-dose amiodarone  continue the same as well as her current anticoagulant Blood pressure is well-controlled continue treatment including combination ACE inhibitor thiazide diuretic as well as the rate limiting calcium  channel blocker she takes along with her flecainide    Next appointment: 9 months   Medication Adjustments/Labs and Tests Ordered: Current medicines are reviewed at length with the patient today.  Concerns regarding medicines are outlined above.  No orders of the defined types were placed in this encounter.  No orders of the defined types were placed in this encounter.    History of Present Illness:    Gina Coffey is a 73 y.o. female with a hx of paroxysmal atrial fibrillation maintaining sinus rhythm with flecainide  therapy chronic anticoagulation hypertension and hyperlipidemia last seen 06/23/2023. 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 denies   Arthritis    Chronic anticoagulation 08/10/2017   Depression    Dysrhythmia    atrial fibrillation   Family history of adverse reaction to anesthesia    brother aspirated and died . Mother confused after anesthesia   High risk medication use 04/06/2018   Flecanide   Hypercholesteremia 08/05/2017   Hypertension    Hypertensive heart disease 08/05/2017   PAF (paroxysmal atrial fibrillation) (HCC) 04/29/2015   Overview:  Brief, converted to [facility] while in [facility] ED, CBC and CMP were normal, CHADS2 vasc score= 3, was discharged on a beta blocker and asprin   Palpitations 08/05/2017   Pneumonia    in 2006   Primary localized osteoarthritis of left knee 12/09/2016   Primary localized osteoarthritis of right knee 05/25/2018   S/P knee replacement 12/09/2016   left   Severe obesity (BMI >= 40) (HCC) 12/09/2016    Current Medications: No outpatient medications have been marked as taking for the 01/15/24 encounter (Appointment) with Leiter Redell PARAS, MD.      EKGs/Labs/Other Studies Reviewed:    The following studies were reviewed today:  Cardiac Studies & Procedures   ______________________________________________________________________________________________   STRESS TESTS  MYOCARDIAL PERFUSION IMAGING 02/11/2018            ______________________________________________________________________________________________      EKG Interpretation Date/Time:  Friday January 15 2024 13:41:43 EDT Ventricular Rate:  61 PR Interval:  166 QRS Duration:  118 QT Interval:  424 QTC  Calculation: 426 R Axis:   -29  Text Interpretation: Sinus rhythm Artifact Left ventricular hypertrophy with QRS widening ( R in aVL , Cornell product ) When compared with ECG of 23-Jun-2023 08:55, unchanged Confirmed by Monetta Rogue ([id number]) on 01/15/2024 1:48:12 PM     Physical Exam:    VS:   There were no vitals taken for this visit.    Wt Readings from Last 3 Encounters:  06/23/23 298 lb 6.4 oz (135.4 kg)  12/18/22 (!) 308 lb 12.8 oz (140.1 kg)  10/30/21 (!) 306 lb (138.8 kg)     GEN:  Well nourished, well developed in no acute distress HEENT: Normal NECK: No JVD; No carotid bruits LYMPHATICS: No lymphadenopathy CARDIAC: RRR, no murmurs, rubs, gallops RESPIRATORY:  Clear to auscultation without rales, wheezing or rhonchi  ABDOMEN: Soft, non-tender, non-distended MUSCULOSKELETAL:  No edema; No deformity  SKIN: Warm and dry NEUROLOGIC:  Alert and oriented x 3 PSYCHIATRIC:  Normal affect    Signed, Rogue Monetta, MD  01/14/2024 7:20 PM    [facility]

## 2024-01-15 ENCOUNTER — Ambulatory Visit: Attending: Cardiology | Admitting: Cardiology

## 2024-01-15 VITALS — BP 130/58 | HR 61 | Ht 65.0 in | Wt 309.4 lb

## 2024-01-15 DIAGNOSIS — Z79899 Other long term (current) drug therapy: Secondary | ICD-10-CM | POA: Diagnosis not present

## 2024-01-15 DIAGNOSIS — I480 Paroxysmal atrial fibrillation: Secondary | ICD-10-CM

## 2024-01-15 DIAGNOSIS — Z7901 Long term (current) use of anticoagulants: Secondary | ICD-10-CM

## 2024-01-15 DIAGNOSIS — I119 Hypertensive heart disease without heart failure: Secondary | ICD-10-CM | POA: Diagnosis not present

## 2024-01-15 NOTE — Patient Instructions (Signed)
 Medication Instructions:  Your physician recommends that you continue on your current medications as directed. Please refer to the Current Medication list given to you today.  *If you need a refill on your cardiac medications before your next appointment, please call your pharmacy*  Lab Work: None If you have labs (blood work) drawn today and your tests are completely normal, you will receive your results only by: MyChart Message (if you have MyChart) OR A paper copy in the mail If you have any lab test that is abnormal or we need to change your treatment, we will call you to review the results.  Testing/Procedures: None  Follow-Up: At [facility], you and your health needs are our priority.  As part of our continuing mission to provide you with exceptional heart care, our providers are all part of one team.  This team includes your primary Cardiologist (physician) and Advanced Practice Providers or APPs (Physician Assistants and Nurse Practitioners) who all work together to provide you with the care you need, when you need it.  Your next appointment:   9 month(s)  Provider:   Zoe Hinds, MD    We recommend signing up for the patient portal called "MyChart".  Sign up information is provided on this After Visit Summary.  MyChart is used to connect with patients for Virtual Visits (Telemedicine).  Patients are able to view lab/test results, encounter notes, upcoming appointments, etc.  Non-urgent messages can be sent to your provider as well.   To learn more about what you can do with MyChart, go to [email or web address].   Other Instructions None

## 2024-02-16 DIAGNOSIS — H903 Sensorineural hearing loss, bilateral: Secondary | ICD-10-CM | POA: Diagnosis not present

## 2024-02-29 DIAGNOSIS — R635 Abnormal weight gain: Secondary | ICD-10-CM | POA: Diagnosis not present

## 2024-02-29 DIAGNOSIS — I10 Essential (primary) hypertension: Secondary | ICD-10-CM | POA: Diagnosis not present

## 2024-02-29 DIAGNOSIS — F419 Anxiety disorder, unspecified: Secondary | ICD-10-CM | POA: Diagnosis not present

## 2024-02-29 DIAGNOSIS — D6869 Other thrombophilia: Secondary | ICD-10-CM | POA: Diagnosis not present

## 2024-02-29 DIAGNOSIS — Z6841 Body Mass Index (BMI) 40.0 and over, adult: Secondary | ICD-10-CM | POA: Diagnosis not present

## 2024-02-29 DIAGNOSIS — I480 Paroxysmal atrial fibrillation: Secondary | ICD-10-CM | POA: Diagnosis not present
